# Patient Record
Sex: Female | Born: 2000 | Race: Black or African American | Hispanic: No | Marital: Single | Smoking: Never smoker
Health system: Southern US, Community
[De-identification: ages and names within clinical notes are randomized; demographics above are authoritative.]

## PROBLEM LIST (undated history)

## (undated) ENCOUNTER — Inpatient Hospital Stay (HOSPITAL_COMMUNITY): Payer: Self-pay

## (undated) DIAGNOSIS — D649 Anemia, unspecified: Secondary | ICD-10-CM

## (undated) DIAGNOSIS — N390 Urinary tract infection, site not specified: Secondary | ICD-10-CM

## (undated) DIAGNOSIS — B009 Herpesviral infection, unspecified: Secondary | ICD-10-CM

## (undated) DIAGNOSIS — B9689 Other specified bacterial agents as the cause of diseases classified elsewhere: Secondary | ICD-10-CM

## (undated) DIAGNOSIS — N760 Acute vaginitis: Secondary | ICD-10-CM

## (undated) DIAGNOSIS — A549 Gonococcal infection, unspecified: Secondary | ICD-10-CM

## (undated) DIAGNOSIS — Z789 Other specified health status: Secondary | ICD-10-CM

## (undated) DIAGNOSIS — A599 Trichomoniasis, unspecified: Secondary | ICD-10-CM

## (undated) DIAGNOSIS — A749 Chlamydial infection, unspecified: Secondary | ICD-10-CM

## (undated) HISTORY — DX: Herpesviral infection, unspecified: B00.9

## (undated) HISTORY — PX: NO PAST SURGERIES: SHX2092

---
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
## 2021-02-13 ENCOUNTER — Encounter (HOSPITAL_COMMUNITY): Payer: Self-pay | Admitting: Emergency Medicine

## 2021-02-13 ENCOUNTER — Other Ambulatory Visit: Payer: Self-pay

## 2021-02-13 ENCOUNTER — Emergency Department (HOSPITAL_COMMUNITY)
Admission: EM | Admit: 2021-02-13 | Discharge: 2021-02-13 | Disposition: A | Discharge status: Home / Self Care | Payer: Medicaid Other | Attending: Emergency Medicine | Admitting: Emergency Medicine

## 2021-02-13 DIAGNOSIS — Z202 Contact with and (suspected) exposure to infections with a predominantly sexual mode of transmission: Secondary | ICD-10-CM

## 2021-02-13 DIAGNOSIS — B3731 Acute candidiasis of vulva and vagina: Secondary | ICD-10-CM | POA: Insufficient documentation

## 2021-02-13 DIAGNOSIS — N898 Other specified noninflammatory disorders of vagina: Secondary | ICD-10-CM | POA: Diagnosis present

## 2021-02-13 LAB — WET PREP, GENITAL
Clue Cells Wet Prep HPF POC: NONE SEEN
Sperm: NONE SEEN
Trich, Wet Prep: NONE SEEN

## 2021-02-13 LAB — URINALYSIS, ROUTINE W REFLEX MICROSCOPIC
Bilirubin Urine: NEGATIVE
Glucose, UA: NEGATIVE mg/dL
Hgb urine dipstick: NEGATIVE
Ketones, ur: NEGATIVE mg/dL
Nitrite: NEGATIVE
Protein, ur: NEGATIVE mg/dL
Specific Gravity, Urine: 1.017 (ref 1.005–1.030)
pH: 5 (ref 5.0–8.0)

## 2021-02-13 LAB — HIV ANTIBODY (ROUTINE TESTING W REFLEX): HIV Screen 4th Generation wRfx: NONREACTIVE

## 2021-02-13 LAB — PREGNANCY, URINE: Preg Test, Ur: NEGATIVE

## 2021-02-13 MED ORDER — DOXYCYCLINE HYCLATE 100 MG PO CAPS: 100.0000 mg | ORAL_CAPSULE | Freq: Two times a day (BID) | ORAL | 0 refills | Status: DC

## 2021-02-13 MED ORDER — FLUCONAZOLE 200 MG PO TABS
200.0000 mg | ORAL_TABLET | Freq: Once | ORAL | Status: DC
  Filled 2021-02-13: qty 1

## 2021-02-13 MED ORDER — FLUCONAZOLE 200 MG PO TABS: 200.0000 mg | ORAL_TABLET | Freq: Every day | ORAL | 0 refills | Status: AC

## 2021-02-13 MED ORDER — STERILE WATER FOR INJECTION IJ SOLN
INTRAMUSCULAR | Status: AC
  Filled 2021-02-13: qty 10

## 2021-02-13 MED ORDER — DOXYCYCLINE HYCLATE 100 MG PO TABS: 100.0000 mg | ORAL_TABLET | Freq: Once | ORAL | Status: DC

## 2021-02-13 MED ORDER — CEFTRIAXONE SODIUM 500 MG IJ SOLR
500.0000 mg | Freq: Once | INTRAMUSCULAR | Status: AC
  Administered 2021-02-13: 500 mg via INTRAMUSCULAR
  Filled 2021-02-13: qty 500

## 2021-02-13 NOTE — Discharge Instructions (Addendum)
If the cultures come back on your MyChart account in the next 1 to 2 days and they are negative you can stop taking the doxycycline which is the antibiotic.  You were also given a prescription for a yeast medication which you did test positive for yeast as well.  In addition to the pills get the over-the-counter Monistat and use 1 or 2 applications of that as well which should clear up the yeast infection.  Avoid any sexual encounters until your symptoms completely resolve.  If you do test positive for gonorrhea or chlamydia your partner needs treatment.

## 2021-02-13 NOTE — ED Triage Notes (Signed)
Pt states she thinks she has a yeast infection.  Reports intermittent vaginal itching and yellow vaginal discharge.  Also reports urinary urgency.

## 2021-02-13 NOTE — ED Provider Notes (Signed)
Emergency Medicine Provider Triage Evaluation Note  Natasha Kelly , a 20 y.o. female  was evaluated in triage.  Pt complains of yellow vaginal discharge over the last 3 weeks.  She does have a history of yeast infections and states this feels similar.  She also mentions that her boyfriend recently cheated on her prior to her symptoms starting.  She reports associated urinary urgency but denies any dysuria, hematuria, fever, chills, and pelvic pain.  Review of Systems  Positive:  Negative: See above   Physical Exam  BP 128/68 (BP Location: Right Arm)   Pulse 84   Temp 98.8 F (37.1 C) (Oral)   Resp 16   LMP 01/23/2021   SpO2 100%  Gen:   Awake, no distress   Resp:  Normal effort  MSK:   Moves extremities without difficulty  Other:    Medical Decision Making  Medically screening exam initiated at 12:20 PM.  Appropriate orders placed.  Floella Ensz was informed that the remainder of the evaluation will be completed by another provider, this initial triage assessment does not replace that evaluation, and the importance of remaining in the ED until their evaluation is complete.    Honor Loh Butner, PA-C 02/13/21 1222    Derwood Kaplan, MD 02/14/21 [phone]
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
## 2021-02-14 LAB — GC/CHLAMYDIA PROBE AMP (~~LOC~~) NOT AT ARMC
Chlamydia: NEGATIVE
Comment: NEGATIVE
Comment: NORMAL
Neisseria Gonorrhea: NEGATIVE

## 2021-02-14 LAB — RPR: RPR Ser Ql: NONREACTIVE

## 2021-03-22 ENCOUNTER — Other Ambulatory Visit: Payer: Self-pay

## 2021-03-22 ENCOUNTER — Encounter (HOSPITAL_COMMUNITY): Payer: Self-pay | Admitting: Emergency Medicine

## 2021-03-22 ENCOUNTER — Emergency Department (HOSPITAL_COMMUNITY)
Admission: EM | Admit: 2021-03-22 | Discharge: 2021-03-22 | Disposition: A | Discharge status: Home / Self Care | Payer: Medicaid Other | Attending: Emergency Medicine | Admitting: Emergency Medicine

## 2021-03-22 DIAGNOSIS — A5901 Trichomonal vulvovaginitis: Secondary | ICD-10-CM | POA: Insufficient documentation

## 2021-03-22 DIAGNOSIS — N898 Other specified noninflammatory disorders of vagina: Secondary | ICD-10-CM | POA: Diagnosis present

## 2021-03-22 LAB — WET PREP, GENITAL
Clue Cells Wet Prep HPF POC: NONE SEEN
Sperm: NONE SEEN
Yeast Wet Prep HPF POC: NONE SEEN

## 2021-03-22 MED ORDER — LIDOCAINE HCL (PF) 1 % IJ SOLN
1.0000 mL | Freq: Once | INTRAMUSCULAR | Status: AC
  Administered 2021-03-22: 1 mL
  Filled 2021-03-22: qty 5

## 2021-03-22 MED ORDER — METRONIDAZOLE 500 MG PO TABS
2000.0000 mg | ORAL_TABLET | Freq: Once | ORAL | Status: AC
  Administered 2021-03-22: 2000 mg via ORAL
  Filled 2021-03-22: qty 4

## 2021-03-22 MED ORDER — DOXYCYCLINE HYCLATE 100 MG PO TABS: 100.0000 mg | ORAL_TABLET | Freq: Two times a day (BID) | ORAL | 0 refills | Status: AC

## 2021-03-22 MED ORDER — FLUCONAZOLE 150 MG PO TABS: 150.0000 mg | ORAL_TABLET | ORAL | 0 refills | Status: AC

## 2021-03-22 MED ORDER — CEFTRIAXONE SODIUM 500 MG IJ SOLR
500.0000 mg | Freq: Once | INTRAMUSCULAR | Status: AC
  Administered 2021-03-22: 500 mg via INTRAMUSCULAR
  Filled 2021-03-22: qty 500

## 2021-03-22 NOTE — ED Provider Notes (Signed)
[facility] EMERGENCY DEPARTMENT Provider Note   CSN: [id number] Arrival date & time: 03/22/21  1612     History Chief Complaint  Patient presents with   Vaginal Discharge    Natasha Kelly is a 20 y.o. female.   Vaginal Discharge  Patient presents with complaints of vaginal discharge and irritation that started over a week ago.  Patient thinks she might have a yeast infection.  She also wants to make sure she does not have chlamydia.  Patient has had intercourse recently and the condom broke.  Patient has noticed some yellow discharge from the vaginal area.  She is also having some vaginal itching and burning.  She denies any dysuria although does have some suprapubic discomfort.  No fevers or chills.  Patient was seen in the emergency room last month and was diagnosed with vaginal candidiasis.  She feels that her symptoms are similar.  Patient is also concerned that she has chlamydia.  She is not aware of any definite partner exposure but she is requesting empiric treatment  Past Medical History:  Diagnosis Date   Medical history non-contributory     There are no problems to display for this patient.   Past Surgical History:  Procedure Laterality Date   NO PAST SURGERIES       OB History     Gravida  1   Para      Term      Preterm      AB  1   Living         SAB  1   IAB      Ectopic      Multiple      Live Births              Family History  Problem Relation Age of Onset   Healthy Mother    Healthy Father     Social History   Tobacco Use   Smoking status: Never   Smokeless tobacco: Never  Vaping Use   Vaping Use: Never used  Substance Use Topics   Alcohol use: Never   Drug use: Never    Home Medications Prior to Admission medications   Medication Sig Start Date End Date Taking? Authorizing Provider  doxycycline (VIBRA-TABS) 100 MG tablet Take 1 tablet (100 mg total) by mouth 2 (two) times daily for 7 days.  03/22/21 03/29/21 Yes Linwood Dibbles, MD  fluconazole (DIFLUCAN) 150 MG tablet Take 1 tablet (150 mg total) by mouth every 3 (three) days for 3 doses. 03/22/21 03/29/21 Yes Linwood Dibbles, MD  doxycycline (VIBRAMYCIN) 100 MG capsule Take 1 capsule (100 mg total) by mouth 2 (two) times daily. 02/13/21   Gwyneth Sprout, MD  ferrous sulfate 325 (65 FE) MG tablet Take 1 tablet (325 mg total) by mouth every other day. 09/07/20 10/07/20  Alric Seton, MD  vitamin C (ASCORBIC ACID) 250 MG tablet Take 1 tablet (250 mg total) by mouth every other day. Take with ferrous sulfate Patient not taking: Reported on 09/14/2020 09/07/20   Alric Seton, MD    Allergies    Patient has no known allergies.  Review of Systems   Review of Systems  Genitourinary:  Positive for vaginal discharge.  All other systems reviewed and are negative.  Physical Exam Updated Vital Signs BP (!) 142/83   Pulse (!) 104   Temp 98.1 F (36.7 C) (Oral)   Resp 16   Ht 1.6 m (5\' 3" )   Wt  56.7 kg   LMP 03/21/2021   SpO2 100%   BMI 22.14 kg/m   Physical Exam Vitals and nursing note reviewed.  Constitutional:      General: She is not in acute distress.    Appearance: She is well-developed.  HENT:     Head: Normocephalic and atraumatic.     Right Ear: External ear normal.     Left Ear: External ear normal.  Eyes:     General: No scleral icterus.       Right eye: No discharge.        Left eye: No discharge.     Conjunctiva/sclera: Conjunctivae normal.  Neck:     Trachea: No tracheal deviation.  Cardiovascular:     Rate and Rhythm: Normal rate.  Pulmonary:     Effort: Pulmonary effort is normal. No respiratory distress.     Breath sounds: No stridor.  Abdominal:     General: There is no distension.  Genitourinary:    Exam position: Supine.     Labia:        Right: No rash, tenderness, lesion or injury.        Left: No rash, tenderness, lesion or injury.      Vagina: No signs of injury and foreign body.  Vaginal discharge (white, thick) present. No erythema or tenderness.     Cervix: No cervical motion tenderness, discharge or friability.     Uterus: Not tender.      Adnexa:        Right: No mass, tenderness or fullness.         Left: No mass, tenderness or fullness.    Musculoskeletal:        General: No swelling or deformity.     Cervical back: Neck supple.  Skin:    General: Skin is warm and dry.     Findings: No rash.  Neurological:     Mental Status: She is alert.     Cranial Nerves: Cranial nerve deficit: no gross deficits.    ED Results / Procedures / Treatments   Labs (all labs ordered are listed, but only abnormal results are displayed) Labs Reviewed  WET PREP, GENITAL - Abnormal; Notable for the following components:      Result Value   Trich, Wet Prep PRESENT (*)    WBC, Wet Prep HPF POC MODERATE (*)    All other components within normal limits  I-STAT BETA HCG BLOOD, ED ([facility], [facility], [facility] ONLY)  GC/CHLAMYDIA PROBE AMP ([place]) NOT AT [facility]    EKG None  Radiology No results found.  Procedures Procedures   Medications Ordered in ED Medications  cefTRIAXone (ROCEPHIN) injection 500 mg (500 mg Intramuscular Given 03/22/21 1825)  lidocaine (PF) (XYLOCAINE) 1 % injection 1 mL (1 mL Other Given 03/22/21 1826)  metroNIDAZOLE (FLAGYL) tablet 2,000 mg (2,000 mg Oral Given 03/22/21 1826)    ED Course  I have reviewed the triage vital signs and the nursing notes.  Pertinent labs & imaging results that were available during my care of the patient were reviewed by me and considered in my medical decision making (see chart for details).  Clinical Course as of 03/22/21 2334  Tue Mar 22, 2021  1716 Prior records reviewed.  Patient had an HIV and RPR test done last month.  Both negative [JK]    Clinical Course User Index [JK] Linwood Dibbles, MD   MDM Rules/Calculators/A&P  Patient with complaints of vaginal discharge.  She does have thick white  discharge suggestive of yeast infection.  Patient is also concerned about STI and is requesting empiric treatment.  Will discharge home with prescription for doxycycline.  Patient was given ceftriaxone in the ED.  I will also give her a few days of Diflucan considering her recurrent yeast infection symptoms.  Recommend outpatient follow-up with gynecology Final Clinical Impression(s) / ED Diagnoses Final diagnoses:  Vaginal discharge  Trichomonal vaginitis    Rx / DC Orders ED Discharge Orders          Ordered    doxycycline (VIBRA-TABS) 100 MG tablet  2 times daily        03/22/21 1738    fluconazole (DIFLUCAN) 150 MG tablet  Every 3 DAYS        03/22/21 1741             Linwood Dibbles, MD 03/22/21 2334

## 2021-03-22 NOTE — ED Triage Notes (Signed)
Patient requesting to be evaluated for yeast infection and STI. Reports about 3-4 weeks ago treated for a yeast infection and does not believe it has cleared up. Reports recent intercourse where the condom broke. Reports clear to yellow vaginal discharge.

## 2021-03-22 NOTE — ED Notes (Signed)
DC instructions reviewed with pt. Pt verbalized understanding.  PT DC.  

## 2021-03-22 NOTE — Discharge Instructions (Addendum)
Take patient as prescribed.  Schedule an appointment to follow-up with an OB/GYN doctor if the symptoms persist  You were given antibiotics in the ED that treat for trichomonas and gonorrhea.  The prescriptions will cover chlamydia and a yeast infection.     Your partner should also be treated and you should avoid intercourse until treatment is complete.

## 2021-03-22 NOTE — ED Provider Notes (Signed)
Emergency Medicine Provider Triage Evaluation Note  Natasha Kelly , a 20 y.o. female  was evaluated in triage.  Pt complains of vaginal discharge.  She states that she was evaluated last week and diagnosed with a yeast infection.  She states that she was given Diflucan.  She states that she still feels like she is having a yeast infection and is also concerned about STDs.  She states that she had sex with a female partner was using a condom however this broke.  She states that she is having yellow discharge from her vagina.  She is also having itching and burning.  She does endorse some mild suprapubic abdominal pain.  Denies urinary symptoms or fever.  Last menstrual period ended yesterday.  On chart review it appears that she was found to have vaginal Candida and prescribed fluconazole.  While she was in the emergency department she was also empirically treated for STDs given IM Rocephin and discharged with doxycycline.  Review of Systems  Positive: Vaginal discharge Negative: Fever  Physical Exam  BP 136/88 (BP Location: Right Arm)   Pulse 95   Temp 98.3 F (36.8 C) (Oral)   Resp 18   Ht 5\' 3"  (1.6 m)   Wt 56.7 kg   LMP 03/21/2021   SpO2 100%   BMI 22.14 kg/m  Gen:   Awake, no distress   Resp:  Normal effort  MSK:   Moves extremities without difficulty  Other:  Abdomen is soft.  Bowel sounds present.  She has mild tenderness to palpation of the suprapubic region.  No rebound tenderness.  Medical Decision Making  Medically screening exam initiated at 4:40 PM.  Appropriate orders placed.  Natasha Kelly was informed that the remainder of the evaluation will be completed by another provider, this initial triage assessment does not replace that evaluation, and the importance of remaining in the ED until their evaluation is complete.     Natasha Leyden, PA-C 03/22/21 1642    13/08/22, MD 03/23/21 [phone]

## 2021-03-23 LAB — GC/CHLAMYDIA PROBE AMP (~~LOC~~) NOT AT ARMC
Chlamydia: NEGATIVE
Comment: NEGATIVE
Comment: NORMAL
Neisseria Gonorrhea: NEGATIVE

## 2021-03-24 ENCOUNTER — Telehealth: Payer: Self-pay | Admitting: *Deleted

## 2021-03-24 NOTE — Telephone Encounter (Signed)
Pt called regarding test results.  °

## 2021-03-25 ENCOUNTER — Telehealth (HOSPITAL_COMMUNITY): Payer: Self-pay

## 2021-04-03 ENCOUNTER — Encounter (HOSPITAL_COMMUNITY): Payer: Self-pay | Admitting: Emergency Medicine

## 2021-04-03 ENCOUNTER — Emergency Department (HOSPITAL_COMMUNITY)
Admission: EM | Admit: 2021-04-03 | Discharge: 2021-04-03 | Disposition: A | Discharge status: Home / Self Care | Payer: Medicaid Other | Attending: Emergency Medicine | Admitting: Emergency Medicine

## 2021-04-03 ENCOUNTER — Other Ambulatory Visit: Payer: Self-pay

## 2021-04-03 DIAGNOSIS — N898 Other specified noninflammatory disorders of vagina: Secondary | ICD-10-CM | POA: Diagnosis present

## 2021-04-03 MED ORDER — METRONIDAZOLE 500 MG PO TABS: 500.0000 mg | ORAL_TABLET | Freq: Two times a day (BID) | ORAL | 0 refills | Status: DC

## 2021-04-03 NOTE — ED Triage Notes (Signed)
Pt states she was diagnosed here with trich and completed treatment but continues to have symptoms of discharge, itching, and burning.  Pt denies unprotected sex during treatment.

## 2021-04-03 NOTE — ED Provider Notes (Signed)
[facility] EMERGENCY DEPARTMENT Provider Note   CSN: [id number] Arrival date & time: 04/03/21  1650     History Chief Complaint  Patient presents with   Vaginal Discharge    Natasha Kelly is a 20 y.o. female.  HPI 20 year old female with recent history of trichomoniasis, seen on November 8, also treated with Rocephin,, Doxycycline, Diflucan, presents today with complaints of ongoing vaginal itching and discharge.  She is not having pain.  States her last menstrual period was 2 weeks ago was normal.  She states she is not pregnant.  She has not had fever or abdominal pain.  She did not seek follow-up after her last ED visit.  She reports taking medications as prescribed Review of records reveal she had a negative urine chlamydia test.  She was treated with Rocephin at that time.  She reports using barrier method for birth control.  Past Medical History:  Diagnosis Date   Medical history non-contributory     There are no problems to display for this patient.   Past Surgical History:  Procedure Laterality Date   NO PAST SURGERIES       OB History     Gravida  1   Para      Term      Preterm      AB  1   Living         SAB  1   IAB      Ectopic      Multiple      Live Births              Family History  Problem Relation Age of Onset   Healthy Mother    Healthy Father     Social History   Tobacco Use   Smoking status: Never   Smokeless tobacco: Never  Vaping Use   Vaping Use: Never used  Substance Use Topics   Alcohol use: Never   Drug use: Never    Home Medications Prior to Admission medications   Medication Sig Start Date End Date Taking? Authorizing Provider  metroNIDAZOLE (FLAGYL) 500 MG tablet Take 1 tablet (500 mg total) by mouth 2 (two) times daily. 04/03/21  Yes Margarita Grizzle, MD  doxycycline (VIBRAMYCIN) 100 MG capsule Take 1 capsule (100 mg total) by mouth 2 (two) times daily. 02/13/21   Gwyneth Sprout,  MD  ferrous sulfate 325 (65 FE) MG tablet Take 1 tablet (325 mg total) by mouth every other day. 09/07/20 10/07/20  Alric Seton, MD  vitamin C (ASCORBIC ACID) 250 MG tablet Take 1 tablet (250 mg total) by mouth every other day. Take with ferrous sulfate Patient not taking: Reported on 09/14/2020 09/07/20   Alric Seton, MD    Allergies    Patient has no known allergies.  Review of Systems   Review of Systems  All other systems reviewed and are negative.  Physical Exam Updated Vital Signs BP 129/76 (BP Location: Right Arm)   Pulse 92   Temp 98 F (36.7 C) (Oral)   Resp 19   Ht 1.6 m (5\' 3" )   Wt 56.7 kg   LMP 03/21/2021   SpO2 100%   BMI 22.14 kg/m   Physical Exam Vitals reviewed.  HENT:     Head: Normocephalic.     Right Ear: External ear normal.     Left Ear: External ear normal.     Nose: Nose normal.     Mouth/Throat:  Pharynx: Oropharynx is clear.  Cardiovascular:     Rate and Rhythm: Normal rate.  Pulmonary:     Effort: Pulmonary effort is normal.  Abdominal:     General: Abdomen is flat. Bowel sounds are normal. There is no distension.     Palpations: There is no mass.     Tenderness: There is no abdominal tenderness.  Musculoskeletal:     Cervical back: Normal range of motion.  Skin:    General: Skin is warm.     Capillary Refill: Capillary refill takes less than 2 seconds.  Neurological:     General: No focal deficit present.     Mental Status: She is alert.  Psychiatric:        Mood and Affect: Mood normal.    ED Results / Procedures / Treatments   Labs (all labs ordered are listed, but only abnormal results are displayed) Labs Reviewed - No data to display  EKG None  Radiology No results found.  Procedures Procedures   Medications Ordered in ED Medications - No data to display  ED Course  I have reviewed the triage vital signs and the nursing notes.  Pertinent labs & imaging results that were available during my care  of the patient were reviewed by me and considered in my medical decision making (see chart for details).    MDM Rules/Calculators/A&P                         Patient with no new symptoms since last visit.  Cultures were negative last visit.  I deferred pelvic exam given that she is not having any new symptoms.  Have advised her to take Flagyl 500 mg twice a day for 7 days and to follow-up with the women's clinic. Final Clinical Impression(s) / ED Diagnoses Final diagnoses:  Vaginal itching    Rx / DC Orders ED Discharge Orders          Ordered    metroNIDAZOLE (FLAGYL) 500 MG tablet  2 times daily        04/03/21 1715             Margarita Grizzle, MD 04/03/21 1719

## 2021-04-22 ENCOUNTER — Encounter (HOSPITAL_COMMUNITY): Payer: Self-pay | Admitting: Emergency Medicine

## 2021-04-22 ENCOUNTER — Other Ambulatory Visit: Payer: Self-pay

## 2021-04-22 ENCOUNTER — Emergency Department (HOSPITAL_COMMUNITY)
Admission: EM | Admit: 2021-04-22 | Discharge: 2021-04-22 | Disposition: A | Discharge status: Home / Self Care | Payer: Medicaid Other | Attending: Emergency Medicine | Admitting: Emergency Medicine

## 2021-04-22 DIAGNOSIS — N898 Other specified noninflammatory disorders of vagina: Secondary | ICD-10-CM | POA: Insufficient documentation

## 2021-04-22 LAB — POC URINE PREG, ED: Preg Test, Ur: NEGATIVE

## 2021-04-22 LAB — URINALYSIS, ROUTINE W REFLEX MICROSCOPIC
Bilirubin Urine: NEGATIVE
Glucose, UA: NEGATIVE mg/dL
Hgb urine dipstick: NEGATIVE
Ketones, ur: NEGATIVE mg/dL
Leukocytes,Ua: NEGATIVE
Nitrite: NEGATIVE
Protein, ur: NEGATIVE mg/dL
Specific Gravity, Urine: 1.015 (ref 1.005–1.030)
pH: 8.5 — ABNORMAL HIGH (ref 5.0–8.0)

## 2021-04-22 LAB — WET PREP, GENITAL
Clue Cells Wet Prep HPF POC: NONE SEEN
Sperm: NONE SEEN
Trich, Wet Prep: NONE SEEN
WBC, Wet Prep HPF POC: <10 (ref <10)
Yeast Wet Prep HPF POC: NONE SEEN

## 2021-04-22 NOTE — ED Provider Notes (Signed)
[facility] EMERGENCY DEPARTMENT Provider Note  CSN: [id number] Arrival date & time: 04/22/21 0845    History No chief complaint on file.   Natasha Kelly is a 20 y.o. female here for recurrent vaginal discharge and itching. Has been ongoing off and on since last summer. Seen in the ED several times. Swabs have generally been negative except for Nov 8 when trichomonas was positive. She has not had any outpatient follow up. She reports discharge has come back and she wants to make sure she does not still have trichomonas. She has not had any unprotected sex since her last visit.   Past Medical History:  Diagnosis Date   Medical history non-contributory     Past Surgical History:  Procedure Laterality Date   NO PAST SURGERIES      Family History  Problem Relation Age of Onset   Healthy Mother    Healthy Father     Social History   Tobacco Use   Smoking status: Never   Smokeless tobacco: Never  Vaping Use   Vaping Use: Never used  Substance Use Topics   Alcohol use: Never   Drug use: Never     Home Medications Prior to Admission medications   Medication Sig Start Date End Date Taking? Authorizing Provider  ferrous sulfate 325 (65 FE) MG tablet Take 1 tablet (325 mg total) by mouth every other day. 09/07/20 10/07/20  Alric Seton, MD  vitamin C (ASCORBIC ACID) 250 MG tablet Take 1 tablet (250 mg total) by mouth every other day. Take with ferrous sulfate Patient not taking: Reported on 09/14/2020 09/07/20   Alric Seton, MD     Allergies    Patient has no known allergies.   Review of Systems   Review of Systems A comprehensive review of systems was completed and negative except as noted in HPI.    Physical Exam BP 106/66   Pulse 96   Temp 98.5 F (36.9 C)   Resp 18   LMP 04/14/2021 (Approximate)   SpO2 100%   Physical Exam Vitals and nursing note reviewed.  Constitutional:      Appearance: Normal appearance.  HENT:     Head:  Normocephalic and atraumatic.     Nose: Nose normal.     Mouth/Throat:     Mouth: Mucous membranes are moist.  Eyes:     Extraocular Movements: Extraocular movements intact.     Conjunctiva/sclera: Conjunctivae normal.  Cardiovascular:     Rate and Rhythm: Normal rate.  Pulmonary:     Effort: Pulmonary effort is normal.     Breath sounds: Normal breath sounds.  Abdominal:     General: Abdomen is flat.     Palpations: Abdomen is soft.     Tenderness: There is no abdominal tenderness.  Genitourinary:    Comments: Chaperone present, normal external genitalia, no sores. Mild thick white discharge, cervical os is normal.  Musculoskeletal:        General: No swelling. Normal range of motion.     Cervical back: Neck supple.  Skin:    General: Skin is warm and dry.  Neurological:     General: No focal deficit present.     Mental Status: She is alert.  Psychiatric:        Mood and Affect: Mood normal.     ED Results / Procedures / Treatments   Labs (all labs ordered are listed, but only abnormal results are displayed) Labs Reviewed  URINALYSIS, ROUTINE W REFLEX  MICROSCOPIC - Abnormal; Notable for the following components:      Result Value   pH 8.5 (*)    All other components within normal limits  WET PREP, GENITAL  POC URINE PREG, ED  GC/CHLAMYDIA PROBE AMP ([place]) NOT AT [facility]    EKG None  Radiology No results found.  Procedures Procedures  Medications Ordered in the ED Medications - No data to display   MDM Rules/Calculators/A&P MDM   ED Course  I have reviewed the triage vital signs and the nursing notes.  Pertinent labs & imaging results that were available during my care of the patient were reviewed by me and considered in my medical decision making (see chart for details).  Clinical Course as of 04/22/21 1136  Fri Apr 22, 2021  0354 Hcg is neg.  [CS]  1049 Wet prep is neg.  [CS]  1134 UA is neg. Patient advised no signs of acute infection now.  Again advised to follow up with Gyn. Given referral to [facility].  [CS]    Clinical Course User Index [CS] Pollyann Savoy, MD    Final Clinical Impression(s) / ED Diagnoses Final diagnoses:  Vaginal discharge    Rx / DC Orders ED Discharge Orders     None        Pollyann Savoy, MD 04/22/21 1136

## 2021-04-22 NOTE — ED Triage Notes (Signed)
Pt here from from home with c/o vaginal discharge some burning upon urination , no abnormal bleeding

## 2021-04-25 LAB — GC/CHLAMYDIA PROBE AMP (~~LOC~~) NOT AT ARMC
Chlamydia: NEGATIVE
Comment: NEGATIVE
Comment: NORMAL
Neisseria Gonorrhea: NEGATIVE

## 2021-05-04 ENCOUNTER — Encounter (HOSPITAL_COMMUNITY): Payer: Self-pay | Admitting: Emergency Medicine

## 2021-05-04 ENCOUNTER — Emergency Department (HOSPITAL_COMMUNITY)
Admission: EM | Admit: 2021-05-04 | Discharge: 2021-05-04 | Disposition: A | Discharge status: Home / Self Care | Payer: Medicaid Other | Attending: Emergency Medicine | Admitting: Emergency Medicine

## 2021-05-04 DIAGNOSIS — N898 Other specified noninflammatory disorders of vagina: Secondary | ICD-10-CM | POA: Diagnosis present

## 2021-05-04 DIAGNOSIS — L292 Pruritus vulvae: Secondary | ICD-10-CM | POA: Insufficient documentation

## 2021-05-04 LAB — WET PREP, GENITAL
Clue Cells Wet Prep HPF POC: NONE SEEN
Sperm: NONE SEEN
Trich, Wet Prep: NONE SEEN
WBC, Wet Prep HPF POC: >=10 — AB (ref <10)
Yeast Wet Prep HPF POC: NONE SEEN

## 2021-05-04 LAB — URINALYSIS, ROUTINE W REFLEX MICROSCOPIC
Bilirubin Urine: NEGATIVE
Glucose, UA: NEGATIVE mg/dL
Hgb urine dipstick: NEGATIVE
Ketones, ur: 5 mg/dL — AB
Nitrite: NEGATIVE
Protein, ur: 30 mg/dL — AB
Specific Gravity, Urine: 1.026 (ref 1.005–1.030)
pH: 6 (ref 5.0–8.0)

## 2021-05-04 LAB — POC URINE PREG, ED: Preg Test, Ur: NEGATIVE

## 2021-05-04 NOTE — ED Triage Notes (Signed)
Pt endorses white vaginal discharge and itching since Saturday. Endorses burning with urination.

## 2021-05-04 NOTE — ED Provider Notes (Signed)
[facility] EMERGENCY DEPARTMENT Provider Note   CSN: [id number] Arrival date & time: 05/04/21  6060     History Chief Complaint  Patient presents with   Vaginal Discharge    Natasha Kelly is a 20 y.o. female.   Vaginal Discharge Associated symptoms: dysuria   Associated symptoms: no abdominal pain, no fever, no nausea and no vomiting   Patient presents for white vaginal discharge and itching for the past 4 days.  She has a history of ED visits for similar symptoms.  Most recently, she was seen 12 days ago.  Urinalysis and vaginal swabs were negative for infection at that time.  She has engaged in unprotected sex since her last visit.  She does state that her partner was recently tested positive for trichomonas.  She denies any systemic symptoms.  She has had recent dysuria.    Past Medical History:  Diagnosis Date   Medical history non-contributory     There are no problems to display for this patient.   Past Surgical History:  Procedure Laterality Date   NO PAST SURGERIES       OB History     Gravida  1   Para      Term      Preterm      AB  1   Living         SAB  1   IAB      Ectopic      Multiple      Live Births              Family History  Problem Relation Age of Onset   Healthy Mother    Healthy Father     Social History   Tobacco Use   Smoking status: Never   Smokeless tobacco: Never  Vaping Use   Vaping Use: Never used  Substance Use Topics   Alcohol use: Never   Drug use: Never    Home Medications Prior to Admission medications   Medication Sig Start Date End Date Taking? Authorizing Provider  ferrous sulfate 325 (65 FE) MG tablet Take 1 tablet (325 mg total) by mouth every other day. 09/07/20 10/07/20  Alric Seton, MD  vitamin C (ASCORBIC ACID) 250 MG tablet Take 1 tablet (250 mg total) by mouth every other day. Take with ferrous sulfate Patient not taking: Reported on 09/14/2020 09/07/20    Alric Seton, MD    Allergies    Patient has no known allergies.  Review of Systems   Review of Systems  Constitutional:  Negative for chills, fatigue and fever.  HENT:  Negative for ear pain and sore throat.   Eyes:  Negative for pain and visual disturbance.  Respiratory:  Negative for cough and shortness of breath.   Cardiovascular:  Negative for chest pain and palpitations.  Gastrointestinal:  Negative for abdominal pain, diarrhea, nausea and vomiting.  Genitourinary:  Positive for dysuria and vaginal discharge. Negative for flank pain, frequency, genital sores, hematuria and pelvic pain.  Musculoskeletal:  Negative for arthralgias, back pain, myalgias and neck pain.  Skin:  Negative for color change and rash.  Neurological:  Negative for dizziness, seizures, syncope, weakness, light-headedness and numbness.  Hematological:  Does not bruise/bleed easily.  Psychiatric/Behavioral:  Negative for confusion and decreased concentration.   All other systems reviewed and are negative.  Physical Exam Updated Vital Signs BP 116/69    Pulse 85    Temp 99.3 F (37.4 C) (Oral)  Resp 17    Ht 5\' 2"  (1.575 m)    LMP 04/14/2021 (Approximate)    SpO2 100%    BMI 22.86 kg/m   Physical Exam Vitals and nursing note reviewed. Exam conducted with a chaperone present.  Constitutional:      General: She is not in acute distress.    Appearance: Normal appearance. She is well-developed and normal weight. She is not ill-appearing, toxic-appearing or diaphoretic.  HENT:     Head: Normocephalic and atraumatic.     Right Ear: External ear normal.     Left Ear: External ear normal.     Nose: Nose normal.  Eyes:     General: No scleral icterus.    Extraocular Movements: Extraocular movements intact.     Conjunctiva/sclera: Conjunctivae normal.  Cardiovascular:     Rate and Rhythm: Normal rate and regular rhythm.     Heart sounds: No murmur heard. Pulmonary:     Effort: Pulmonary effort is  normal. No respiratory distress.  Abdominal:     Palpations: Abdomen is soft.     Tenderness: There is no abdominal tenderness. There is no right CVA tenderness or left CVA tenderness.  Genitourinary:    Exam position: Lithotomy position.     Pubic Area: No rash.      Labia:        Right: No lesion.        Left: No lesion.      Vagina: Normal. No foreign body. No erythema or bleeding.     Cervix: No discharge, friability, lesion, erythema or cervical bleeding.  Musculoskeletal:        General: No swelling. Normal range of motion.     Cervical back: Normal range of motion and neck supple.  Skin:    General: Skin is warm and dry.     Capillary Refill: Capillary refill takes less than 2 seconds.     Coloration: Skin is not jaundiced or pale.  Neurological:     General: No focal deficit present.     Mental Status: She is alert and oriented to person, place, and time.     Cranial Nerves: No cranial nerve deficit.     Sensory: No sensory deficit.     Motor: No weakness.  Psychiatric:        Mood and Affect: Mood normal.        Behavior: Behavior normal.        Thought Content: Thought content normal.        Judgment: Judgment normal.    ED Results / Procedures / Treatments   Labs (all labs ordered are listed, but only abnormal results are displayed) Labs Reviewed  WET PREP, GENITAL - Abnormal; Notable for the following components:      Result Value   WBC, Wet Prep HPF POC >=10 (*)    All other components within normal limits  URINALYSIS, ROUTINE W REFLEX MICROSCOPIC - Abnormal; Notable for the following components:   APPearance CLOUDY (*)    Ketones, ur 5 (*)    Protein, ur 30 (*)    Leukocytes,Ua LARGE (*)    Bacteria, UA RARE (*)    All other components within normal limits  POC URINE PREG, ED  GC/CHLAMYDIA PROBE AMP ([place]) NOT AT [facility]    EKG None  Radiology No results found.  Procedures Procedures   Medications Ordered in ED Medications - No data to  display  ED Course  I have reviewed the triage vital signs  and the nursing notes.  Pertinent labs & imaging results that were available during my care of the patient were reviewed by me and considered in my medical decision making (see chart for details).    MDM Rules/Calculators/A&P                         Healthy 20 year old female with concerns of vaginal discharge and irritation.  On exam, she is well-appearing.  Vital signs are normal.  She did undergo a pelvic examination with nurse chaperone present.  On pelvic exam, patient has what appears to be physiologic discharge.  There were no areas of erythema or friability.  Wet prep and GC/chlamydia swabs were obtained.  Wet prep was negative for yeast, BV, or trichomonas infection.  Patient was advised to follow-up on results of GC/committee a testing.  Based on exam, no indication for empiric antibiotic treatment at this time.  Given her frequent visits to the ED for similar symptoms, patient was encouraged to establish follow-up with PCP and/or OB/GYN.  Patient was discharged in good condition.  Final Clinical Impression(s) / ED Diagnoses Final diagnoses:  Vaginal discharge  Vaginal itching    Rx / DC Orders ED Discharge Orders          Ordered    Ambulatory referral to Gynecology        05/04/21 1350             Godfrey Pick, MD 05/04/21 1911

## 2021-05-04 NOTE — ED Notes (Signed)
Pt left without discharge paperwork and vital signs

## 2021-05-04 NOTE — Discharge Instructions (Signed)
Results of gonorrhea and Chlamydia testing will be available in 1 to 2 days.  You can find out results on MyChart.  You should establish a primary care doctor and/or a gynecologist.  There are telephone numbers below to call to do so.  If you are able to schedule a follow-up appointment, you can discuss the results of testing at that time as well.

## 2021-05-04 NOTE — ED Notes (Signed)
rN called pt 3x. No answer

## 2021-05-05 LAB — GC/CHLAMYDIA PROBE AMP (~~LOC~~) NOT AT ARMC
Chlamydia: POSITIVE — AB
Comment: NEGATIVE
Comment: NORMAL
Neisseria Gonorrhea: NEGATIVE

## 2021-05-06 ENCOUNTER — Other Ambulatory Visit: Payer: Self-pay

## 2021-05-06 ENCOUNTER — Emergency Department (HOSPITAL_COMMUNITY)
Admission: EM | Admit: 2021-05-06 | Discharge: 2021-05-06 | Disposition: A | Discharge status: Home / Self Care | Payer: Medicaid Other | Attending: Emergency Medicine | Admitting: Emergency Medicine

## 2021-05-06 ENCOUNTER — Encounter (HOSPITAL_COMMUNITY): Payer: Self-pay | Admitting: Emergency Medicine

## 2021-05-06 ENCOUNTER — Telehealth: Payer: Self-pay

## 2021-05-06 DIAGNOSIS — A749 Chlamydial infection, unspecified: Secondary | ICD-10-CM | POA: Diagnosis not present

## 2021-05-06 DIAGNOSIS — R21 Rash and other nonspecific skin eruption: Secondary | ICD-10-CM | POA: Diagnosis present

## 2021-05-06 MED ORDER — DOXYCYCLINE HYCLATE 100 MG PO TABS: 100.0000 mg | ORAL_TABLET | Freq: Two times a day (BID) | ORAL | 0 refills | Status: DC
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
## 2021-05-06 NOTE — ED Notes (Signed)
RN reviewed discharge instructions w/ pt. Follow up and prescriptions reviewed. Pt had no further questions 

## 2021-05-06 NOTE — ED Triage Notes (Signed)
Pt was seen here Wednesday for STI symptoms, she checked her MyChart today and showed positive for chlamydia, pt here for treatment

## 2021-05-06 NOTE — ED Provider Notes (Signed)
[facility] EMERGENCY DEPARTMENT Provider Note   CSN: [id number] Arrival date & time: 05/06/21  5573     History No chief complaint on file.   Natasha Kelly is a 20 y.o. female no significant past medical history who presents after being evaluated on Wednesday for STI.  Patient reports that she was having some vaginal discharge, vaginal itching, and came to get tested for STI.  Patient denies dyspareunia.  Patient denies vaginal bleeding, reports normal last menstrual period.  Patient denies abdominal pain, nausea, vomiting.  Patient denies worsening of symptoms since she was evaluated in Wednesday, but reports that her chart was positive for chlamydia, negative for gonorrhea.  Otherwise unremarkable work-up.  HPI     Past Medical History:  Diagnosis Date   Medical history non-contributory     There are no problems to display for this patient.   Past Surgical History:  Procedure Laterality Date   NO PAST SURGERIES       OB History     Gravida  1   Para      Term      Preterm      AB  1   Living         SAB  1   IAB      Ectopic      Multiple      Live Births              Family History  Problem Relation Age of Onset   Healthy Mother    Healthy Father     Social History   Tobacco Use   Smoking status: Never   Smokeless tobacco: Never  Vaping Use   Vaping Use: Never used  Substance Use Topics   Alcohol use: Never   Drug use: Never    Home Medications Prior to Admission medications   Medication Sig Start Date End Date Taking? Authorizing Provider  doxycycline (VIBRA-TABS) 100 MG tablet Take 1 tablet (100 mg total) by mouth 2 (two) times daily. 05/06/21  Yes Berenis Corter H, PA-C  ferrous sulfate 325 (65 FE) MG tablet Take 1 tablet (325 mg total) by mouth every other day. 09/07/20 10/07/20  Alric Seton, MD  vitamin C (ASCORBIC ACID) 250 MG tablet Take 1 tablet (250 mg total) by mouth every other day. Take  with ferrous sulfate Patient not taking: Reported on 09/14/2020 09/07/20   Alric Seton, MD    Allergies    Patient has no known allergies.  Review of Systems   Review of Systems  Genitourinary:  Positive for vaginal discharge.  All other systems reviewed and are negative.  Physical Exam Updated Vital Signs BP 132/63 (BP Location: Left Arm)    Pulse 87    Temp 98.7 F (37.1 C) (Oral)    Resp 14    LMP 04/14/2021 (Approximate)    SpO2 99%   Physical Exam Vitals and nursing note reviewed.  Constitutional:      General: She is not in acute distress.    Appearance: Normal appearance.  HENT:     Head: Normocephalic and atraumatic.  Eyes:     General:        Right eye: No discharge.        Left eye: No discharge.  Cardiovascular:     Rate and Rhythm: Normal rate and regular rhythm.  Pulmonary:     Effort: Pulmonary effort is normal. No respiratory distress.  Abdominal:     Comments:  No tenderness palpation throughout the abdomen.  No rebound, rigidity, guarding.  No tenderness palpation suprapubic region.  Genitourinary:    Comments: I did not repeat pelvic exam today, patient was fully evaluated 2 days ago with no new symptoms. Musculoskeletal:        General: No deformity.  Skin:    General: Skin is warm and dry.  Neurological:     Mental Status: She is alert and oriented to person, place, and time.  Psychiatric:        Mood and Affect: Mood normal.        Behavior: Behavior normal.    ED Results / Procedures / Treatments   Labs (all labs ordered are listed, but only abnormal results are displayed) Labs Reviewed - No data to display  EKG None  Radiology No results found.  Procedures Procedures   Medications Ordered in ED Medications - No data to display  ED Course  I have reviewed the triage vital signs and the nursing notes.  Pertinent labs & imaging results that were available during my care of the patient were reviewed by me and considered in my  medical decision making (see chart for details).    MDM Rules/Calculators/A&P                         Overall well-appearing patient with no significant past medical history presents for treatment for chlamydia after receiving positive diagnosis.  Patient had pelvic exam without significant signs or symptoms of tubo-ovarian abscess.  Patient is afebrile, stable vital signs.  Otherwise unremarkable work-up from 2 days ago.  Patient with no abdominal pain on my exam, no acute distress.  We will not repeat pelvic exam at this time.  Patient given 7-day prescription for doxycycline, encouraged to refrain from sexual activity until she is fully symptom-free, and is completely completed her treatment.  Patient encouraged to inform all current sexual partners of her diagnosis.  Patient discharged in stable condition, return precautions given.  Final Clinical Impression(s) / ED Diagnoses Final diagnoses:  Chlamydia    Rx / DC Orders ED Discharge Orders          Ordered    doxycycline (VIBRA-TABS) 100 MG tablet  2 times daily        05/06/21 1715             Surena Welge, Edyth Gunnels 05/06/21 2019    Eber Hong, MD 05/06/21 2308

## 2021-05-06 NOTE — Telephone Encounter (Signed)
Natasha Kelly referring for vaginal discharge and irritation.. Called and left voicemail for patient to call back to be scheduled.

## 2021-05-10 NOTE — Telephone Encounter (Signed)
Called and left voicemail for patient to call back to be scheduled. 

## 2021-05-11 NOTE — Telephone Encounter (Signed)
Called and left voicemail for patient to call back to be scheduled. 

## 2021-05-13 NOTE — Telephone Encounter (Signed)
Called and left voicemail for patient to call back to be scheduled. Contacting referring provider about multiple attempts to reach patient were unsuccessful.
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
## 2021-06-02 ENCOUNTER — Other Ambulatory Visit: Payer: Self-pay

## 2021-06-02 ENCOUNTER — Emergency Department (HOSPITAL_COMMUNITY)
Admission: EM | Admit: 2021-06-02 | Discharge: 2021-06-03 | Disposition: A | Discharge status: Home / Self Care | Payer: Medicaid Other | Attending: Emergency Medicine | Admitting: Emergency Medicine

## 2021-06-02 ENCOUNTER — Encounter (HOSPITAL_COMMUNITY): Payer: Self-pay

## 2021-06-02 DIAGNOSIS — Z202 Contact with and (suspected) exposure to infections with a predominantly sexual mode of transmission: Secondary | ICD-10-CM | POA: Diagnosis not present

## 2021-06-02 DIAGNOSIS — Z113 Encounter for screening for infections with a predominantly sexual mode of transmission: Secondary | ICD-10-CM
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
## 2021-06-03 ENCOUNTER — Telehealth (HOSPITAL_COMMUNITY): Payer: Self-pay

## 2021-06-03 ENCOUNTER — Telehealth: Payer: Self-pay | Admitting: *Deleted

## 2021-06-03 LAB — WET PREP, GENITAL
Clue Cells Wet Prep HPF POC: NONE SEEN
Sperm: NONE SEEN
WBC, Wet Prep HPF POC: >=10 — AB (ref <10)

## 2021-06-03 LAB — GC/CHLAMYDIA PROBE AMP (~~LOC~~) NOT AT ARMC
Chlamydia: NEGATIVE
Comment: NEGATIVE
Comment: NORMAL
Neisseria Gonorrhea: NEGATIVE

## 2021-06-03 LAB — RPR: RPR Ser Ql: NONREACTIVE

## 2021-06-03 LAB — HIV ANTIBODY (ROUTINE TESTING W REFLEX): HIV Screen 4th Generation wRfx: NONREACTIVE

## 2021-06-03 LAB — PREGNANCY, URINE: Preg Test, Ur: NEGATIVE

## 2021-06-03 MED ORDER — AZITHROMYCIN 250 MG PO TABS: 1000.0000 mg | ORAL_TABLET | Freq: Every day | ORAL | Status: DC

## 2021-06-03 MED ORDER — METRONIDAZOLE 500 MG PO TABS
2000.0000 mg | ORAL_TABLET | Freq: Once | ORAL | Status: AC
  Administered 2021-06-03: 2000 mg via ORAL
  Filled 2021-06-03: qty 4

## 2021-06-03 MED ORDER — STERILE WATER FOR INJECTION IJ SOLN
INTRAMUSCULAR | Status: AC
  Administered 2021-06-03: 1 mL
  Filled 2021-06-03: qty 10

## 2021-06-03 MED ORDER — AZITHROMYCIN 250 MG PO TABS
1000.0000 mg | ORAL_TABLET | Freq: Once | ORAL | Status: AC
  Administered 2021-06-03: 1000 mg via ORAL
  Filled 2021-06-03: qty 4

## 2021-06-03 MED ORDER — CEFTRIAXONE SODIUM 500 MG IJ SOLR
500.0000 mg | Freq: Once | INTRAMUSCULAR | Status: AC
  Administered 2021-06-03: 500 mg via INTRAMUSCULAR
  Filled 2021-06-03: qty 500
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
## 2021-06-03 NOTE — Telephone Encounter (Signed)
Pt called after viewing results from labs on MyChart wanting to know next steps.  RNCM advised that someone will reach out to her.  RNCM consulted ED Navigator to assist with this matter.
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
## 2021-06-10 ENCOUNTER — Telehealth (HOSPITAL_COMMUNITY): Payer: Self-pay

## 2021-06-13 ENCOUNTER — Encounter (HOSPITAL_COMMUNITY): Payer: Self-pay | Admitting: Emergency Medicine

## 2021-06-13 ENCOUNTER — Emergency Department (HOSPITAL_COMMUNITY)
Admission: EM | Admit: 2021-06-13 | Discharge: 2021-06-13 | Disposition: A | Discharge status: Home / Self Care | Payer: Medicaid Other | Attending: Emergency Medicine | Admitting: Emergency Medicine

## 2021-06-13 DIAGNOSIS — A599 Trichomoniasis, unspecified: Secondary | ICD-10-CM | POA: Diagnosis not present

## 2021-06-13 DIAGNOSIS — B3731 Acute candidiasis of vulva and vagina: Secondary | ICD-10-CM | POA: Insufficient documentation

## 2021-06-13 MED ORDER — FLUCONAZOLE 200 MG PO TABS: 200.0000 mg | ORAL_TABLET | Freq: Every day | ORAL | 0 refills | Status: AC

## 2021-06-13 MED ORDER — METRONIDAZOLE 500 MG PO TABS: 500.0000 mg | ORAL_TABLET | Freq: Two times a day (BID) | ORAL | 0 refills | Status: DC

## 2021-06-13 NOTE — Discharge Instructions (Addendum)
Start treatment with the Flagyl for trich.  You are still having itching after 2 days of Flagyl, take the Diflucan tablet.
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
## 2021-06-13 NOTE — ED Notes (Signed)
Pt stated she does have a burning sensation when peeing. She has mild discomfort in her lower abdomen. Pt came in to receive tx for STI dx.

## 2021-06-13 NOTE — ED Triage Notes (Signed)
Patient here after receiving positive trichomonas result in MyChart app. Patient alert, oriented, and in no apparent distress at this time.

## 2021-06-19 ENCOUNTER — Emergency Department (HOSPITAL_COMMUNITY)
Admission: EM | Admit: 2021-06-19 | Discharge: 2021-06-19 | Disposition: A | Discharge status: Home / Self Care | Payer: Medicaid Other | Attending: Emergency Medicine | Admitting: Emergency Medicine

## 2021-06-19 ENCOUNTER — Encounter (HOSPITAL_COMMUNITY): Payer: Self-pay | Admitting: Emergency Medicine

## 2021-06-19 DIAGNOSIS — N898 Other specified noninflammatory disorders of vagina: Secondary | ICD-10-CM

## 2021-06-19 LAB — URINALYSIS, ROUTINE W REFLEX MICROSCOPIC
Bilirubin Urine: NEGATIVE
Glucose, UA: NEGATIVE mg/dL
Hgb urine dipstick: NEGATIVE
Ketones, ur: NEGATIVE mg/dL
Nitrite: NEGATIVE
Protein, ur: NEGATIVE mg/dL
Specific Gravity, Urine: 1.02 (ref 1.005–1.030)
pH: 6.5 (ref 5.0–8.0)

## 2021-06-19 LAB — WET PREP, GENITAL
Clue Cells Wet Prep HPF POC: NONE SEEN
Sperm: NONE SEEN
Trich, Wet Prep: NONE SEEN
WBC, Wet Prep HPF POC: >=10 — AB (ref <10)
Yeast Wet Prep HPF POC: NONE SEEN

## 2021-06-19 LAB — POC URINE PREG, ED: Preg Test, Ur: NEGATIVE

## 2021-06-19 LAB — URINALYSIS, MICROSCOPIC (REFLEX)

## 2021-06-19 MED ORDER — DOXYCYCLINE HYCLATE 100 MG PO TABS: 100.0000 mg | ORAL_TABLET | Freq: Two times a day (BID) | ORAL | 0 refills | Status: AC

## 2021-06-19 MED ORDER — LIDOCAINE HCL (PF) 1 % IJ SOLN
1.0000 mL | Freq: Once | INTRAMUSCULAR | Status: AC
  Administered 2021-06-19: 1 mL

## 2021-06-19 MED ORDER — CEFTRIAXONE SODIUM 500 MG IJ SOLR
500.0000 mg | Freq: Once | INTRAMUSCULAR | Status: AC
  Administered 2021-06-19: 500 mg via INTRAMUSCULAR
  Filled 2021-06-19: qty 500

## 2021-06-19 NOTE — Discharge Instructions (Signed)
You were tested and treated today for gonorrhea and chlamydia.  No signs of yeast or trichomonas on your testing today.  Gonorrhea and Chlamydia test results typically take 2-3 days to come back, and you will be called by phone with any positive results.  Negative results can also be viewed online through your MyChart account.  Please make sure you complete entire course of doxycycline prescribed to treat chlamydia since you have been exposed.  It is important that you and your partner both complete treatment before you resume sexual activity.  Please make sure you are using protection when sexually active.  You can go to the health department, primary care doctor or gynecologist for any future STD testing needs.

## 2021-06-19 NOTE — ED Triage Notes (Signed)
Pt seen a recently for trichomonas and treated. Pt still having discharge and lower abd pain. No pain at this time.
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
## 2021-06-20 LAB — GC/CHLAMYDIA PROBE AMP (~~LOC~~) NOT AT ARMC
Chlamydia: NEGATIVE
Comment: NEGATIVE
Comment: NORMAL
Neisseria Gonorrhea: NEGATIVE

## 2021-07-20 ENCOUNTER — Encounter (HOSPITAL_COMMUNITY): Payer: Self-pay | Admitting: *Deleted

## 2021-07-20 ENCOUNTER — Emergency Department (HOSPITAL_COMMUNITY)
Admission: EM | Admit: 2021-07-20 | Discharge: 2021-07-20 | Disposition: A | Discharge status: Home / Self Care | Payer: Medicaid Other | Attending: Emergency Medicine | Admitting: Emergency Medicine

## 2021-07-20 ENCOUNTER — Other Ambulatory Visit: Payer: Self-pay

## 2021-07-20 DIAGNOSIS — N760 Acute vaginitis: Secondary | ICD-10-CM | POA: Diagnosis not present

## 2021-07-20 DIAGNOSIS — N898 Other specified noninflammatory disorders of vagina: Secondary | ICD-10-CM

## 2021-07-20 DIAGNOSIS — B9689 Other specified bacterial agents as the cause of diseases classified elsewhere: Secondary | ICD-10-CM | POA: Insufficient documentation

## 2021-07-20 LAB — URINALYSIS, ROUTINE W REFLEX MICROSCOPIC
Bilirubin Urine: NEGATIVE
Glucose, UA: NEGATIVE mg/dL
Hgb urine dipstick: NEGATIVE
Ketones, ur: NEGATIVE mg/dL
Nitrite: NEGATIVE
Protein, ur: NEGATIVE mg/dL
Specific Gravity, Urine: 1.032 — ABNORMAL HIGH (ref 1.005–1.030)
pH: 6 (ref 5.0–8.0)

## 2021-07-20 LAB — WET PREP, GENITAL
Sperm: NONE SEEN
Trich, Wet Prep: NONE SEEN
WBC, Wet Prep HPF POC: <10 (ref <10)
Yeast Wet Prep HPF POC: NONE SEEN

## 2021-07-20 LAB — PREGNANCY, URINE: Preg Test, Ur: NEGATIVE

## 2021-07-20 MED ORDER — CEFTRIAXONE SODIUM 500 MG IJ SOLR
500.0000 mg | Freq: Once | INTRAMUSCULAR | Status: AC
  Administered 2021-07-20: 500 mg via INTRAMUSCULAR
  Filled 2021-07-20: qty 500

## 2021-07-20 MED ORDER — DOXYCYCLINE HYCLATE 100 MG PO CAPS: 100.0000 mg | ORAL_CAPSULE | Freq: Two times a day (BID) | ORAL | 0 refills | Status: AC

## 2021-07-20 MED ORDER — LIDOCAINE HCL (PF) 1 % IJ SOLN
INTRAMUSCULAR | Status: AC
  Filled 2021-07-20: qty 5

## 2021-07-20 MED ORDER — STERILE WATER FOR INJECTION IJ SOLN
INTRAMUSCULAR | Status: AC
  Filled 2021-07-20: qty 10

## 2021-07-20 MED ORDER — METRONIDAZOLE 500 MG PO TABS: 500.0000 mg | ORAL_TABLET | Freq: Two times a day (BID) | ORAL | 0 refills | Status: AC

## 2021-07-20 NOTE — ED Provider Triage Note (Signed)
Emergency Medicine Provider Triage Evaluation Note ? ?Natasha Kelly , a 21 y.o. female  was evaluated in triage.  Pt complains of increased vaginal discharge, some pain with sex for the last 4 to 5 weeks.  Patient reports that boyfriend had tested positive for trichomonas, reports that he was treated but patient is concerned that she may have trichomonas, chlamydia, gonorrhea.  She reports that she has had chlamydia gonorrhea in the past.  She denies any fever, chills, significant abdominal pain, nausea, vomiting.  She reports last menstrual period was 3 weeks ago and normal for her.  She denies any significant dysuria, hematuria.  Patient declines HIV, syphilis testing at this time. ? ?Review of Systems  ?Positive: Vaginal discharge, dyspareunia ?Negative: Dysuria, hematuria, fever, chills, NVD ? ?Physical Exam  ?BP 131/81 (BP Location: Right Arm)   Pulse 89   Temp 98.9 ?F (37.2 ?C) (Oral)   Resp 14   SpO2 100%  ?Gen:   Awake, no distress   ?Resp:  Normal effort  ?MSK:   Moves extremities without difficulty  ?Other:   ? ?Medical Decision Making  ?Medically screening exam initiated at 2:49 PM.  Appropriate orders placed.  Natasha Kelly was informed that the remainder of the evaluation will be completed by another provider, this initial triage assessment does not replace that evaluation, and the importance of remaining in the ED until their evaluation is complete. ? ?Workup initiated, patient will need pelvic exam ?  ?Natasha Floss, PA-C ?07/20/21 1450 ? ?
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
## 2021-07-20 NOTE — Discharge Instructions (Addendum)
You are seen in the emergency department today for vaginal discharge.  You have a condition called bacterial vaginosis which is an overgrowth of bacteria and not STD.  It does require antibiotics.  I am prescribing you metronidazole 500 mg twice a day for the next 7 days.  You have been prescribed an antibiotic called metronidazole, also known as Flagyl. Some side effects of metronidazole you should be aware of are nausea and headache. Take the medication with food to minimize stomach upset. It is imperative that you do not drink alcohol while taking this antibiotic. Please take the medication as prescribed. Please take the medication until your antibiotic course is complete.  Additionally we are treating you as if you have gonorrhea and chlamydia.  You received a shot here and you are being discharged with doxycycline which she will take twice a day for the next 7 days.  Please return to the emergency department if you have worsening abdominal pain with fever.  Otherwise please follow up with your primary care provider. ?

## 2021-07-20 NOTE — ED Triage Notes (Signed)
Pt reports having vaginal discharge for two weeks. Denies any pain. No distress noted at triage. ?

## 2021-07-21 LAB — GC/CHLAMYDIA PROBE AMP (~~LOC~~) NOT AT ARMC
Chlamydia: NEGATIVE
Comment: NEGATIVE
Comment: NORMAL
Neisseria Gonorrhea: NEGATIVE

## 2021-08-14 ENCOUNTER — Emergency Department (HOSPITAL_COMMUNITY)
Admission: EM | Admit: 2021-08-14 | Discharge: 2021-08-14 | Disposition: A | Discharge status: Home / Self Care | Payer: Medicaid Other | Attending: Emergency Medicine | Admitting: Emergency Medicine

## 2021-08-14 ENCOUNTER — Encounter (HOSPITAL_COMMUNITY): Payer: Self-pay | Admitting: Emergency Medicine

## 2021-08-14 ENCOUNTER — Other Ambulatory Visit: Payer: Self-pay

## 2021-08-14 DIAGNOSIS — N898 Other specified noninflammatory disorders of vagina: Secondary | ICD-10-CM | POA: Diagnosis present

## 2021-08-14 DIAGNOSIS — B9689 Other specified bacterial agents as the cause of diseases classified elsewhere: Secondary | ICD-10-CM | POA: Diagnosis not present

## 2021-08-14 DIAGNOSIS — N760 Acute vaginitis: Secondary | ICD-10-CM | POA: Diagnosis not present

## 2021-08-14 HISTORY — DX: Other specified bacterial agents as the cause of diseases classified elsewhere: B96.89

## 2021-08-14 HISTORY — DX: Acute vaginitis: N76.0

## 2021-08-14 LAB — URINALYSIS, ROUTINE W REFLEX MICROSCOPIC
Bacteria, UA: NONE SEEN
Bilirubin Urine: NEGATIVE
Glucose, UA: NEGATIVE mg/dL
Hgb urine dipstick: NEGATIVE
Ketones, ur: NEGATIVE mg/dL
Nitrite: NEGATIVE
Protein, ur: NEGATIVE mg/dL
Specific Gravity, Urine: 1.027 (ref 1.005–1.030)
pH: 5 (ref 5.0–8.0)

## 2021-08-14 LAB — PREGNANCY, URINE: Preg Test, Ur: NEGATIVE

## 2021-08-14 LAB — WET PREP, GENITAL
Sperm: NONE SEEN
Trich, Wet Prep: NONE SEEN
WBC, Wet Prep HPF POC: >=10 — AB (ref <10)
Yeast Wet Prep HPF POC: NONE SEEN

## 2021-08-14 MED ORDER — METRONIDAZOLE 0.75 % VA GEL: Freq: Every day | VAGINAL | 0 refills | Status: AC

## 2021-08-14 NOTE — Discharge Instructions (Signed)
You do not have a urinary tract infection.  You still have BV, read the information about this attached your discharge papers. ? ?I have sent antibiotic gel to your pharmacy for treatment of this.  For any further STD concerns, follow-up with the [place]. ?

## 2021-08-14 NOTE — ED Triage Notes (Signed)
C/o "white crunchy" vaginal discharge x 1 week with lower abd pain.  States she was seen in ED  3-4 weeks ago and treated for BV.  Denies nausea and vomiting. ?

## 2021-08-14 NOTE — ED Provider Notes (Signed)
?[facility] EMERGENCY DEPARTMENT ?Provider Note ? ? ?CSN: [id number] ?Arrival date & time: 08/14/21  1215 ? ?  ? ?History ? ?Chief Complaint  ?Patient presents with  ? Vaginal Discharge  ? ? ?Natasha Kelly is a 21 y.o. female presenting with a complaint of "crunchy discharge."  Says that this has been going on for about a week.  Last week she was diagnosed with bacterial vaginosis and given metronidazole.  She says that she finished this course of antibiotics.  Denies any abnormal vaginal bleeding or cramping but does note that she has been sexually active since her diagnosis of bacterial vaginosis.  Also endorses some dysuria. ? ? ?Home Medications ?Prior to Admission medications   ?Medication Sig Start Date End Date Taking? Authorizing Provider  ?ferrous sulfate 325 (65 FE) MG tablet Take 1 tablet (325 mg total) by mouth every other day. ?Patient not taking: Reported on 06/02/2021 09/07/20 10/07/20  Alric Seton, MD  ?   ? ?Allergies    ?Patient has no known allergies.   ? ?Review of Systems   ?Review of Systems  ?Genitourinary:  Positive for vaginal discharge.  ? ?Physical Exam ?Updated Vital Signs ?BP 122/78 (BP Location: Right Arm)   Pulse 96   Temp 98.9 ?F (37.2 ?C) (Oral)   Resp 16   LMP 07/10/2021   SpO2 100%  ?Physical Exam ?Vitals and nursing note reviewed.  ?Constitutional:   ?   Appearance: Normal appearance.  ?HENT:  ?   Head: Normocephalic and atraumatic.  ?Eyes:  ?   General: No scleral icterus. ?   Conjunctiva/sclera: Conjunctivae normal.  ?Pulmonary:  ?   Effort: Pulmonary effort is normal. No respiratory distress.  ?Abdominal:  ?   Tenderness: There is abdominal tenderness (Suprapubic). There is no guarding.  ?Genitourinary: ?   Comments: GU exam deferred, self swabs ?Skin: ?   Findings: No rash.  ?Neurological:  ?   Mental Status: She is alert.  ?Psychiatric:     ?   Mood and Affect: Mood normal.  ? ? ?ED Results / Procedures / Treatments   ?Labs ?(all labs ordered are  listed, but only abnormal results are displayed) ?Labs Reviewed  ?WET PREP, GENITAL - Abnormal; Notable for the following components:  ?    Result Value  ? Clue Cells Wet Prep HPF POC PRESENT (*)   ? WBC, Wet Prep HPF POC >=10 (*)   ? All other components within normal limits  ?URINALYSIS, ROUTINE W REFLEX MICROSCOPIC - Abnormal; Notable for the following components:  ? APPearance CLOUDY (*)   ? Leukocytes,Ua LARGE (*)   ? All other components within normal limits  ?PREGNANCY, URINE  ?GC/CHLAMYDIA PROBE AMP ([place]) NOT AT [facility]  ? ? ?EKG ?None ? ?Radiology ?No results found. ? ?Procedures ?Procedures  ? ?Medications Ordered in ED ?Medications - No data to display ? ?ED Course/ Medical Decision Making/ A&P ?  ?                        ?Medical Decision Making ?Amount and/or Complexity of Data Reviewed ?Labs: ordered. ? ? ?21 year old female presenting with the concern for vaginal discharge.  Says this has been going on for a week.  Per chart review, patient was diagnosed with bacterial vaginosis on 3/8.  She says that it got better somewhat and she started to have symptoms again last week. ? ?Patient self swabbed.  STD testing was negative at that time  and patient's vital signs are stable, low suspicion for PID/TOA. ? ?Testing: Wet prep with BV, no yeast.  UA negative for infection ? ?MDM/disposition: Low suspicion emergent causes of her symptoms.  Will be discharged home with metronidazole gel in the event that she failed oral treatment.  She has been referred to the health department due to her many visits for STD related concerns. ? ?Final Clinical Impression(s) / ED Diagnoses ?Final diagnoses:  ?BV (bacterial vaginosis)  ? ? ?Rx / DC Orders ?ED Discharge Orders   ? ?      Ordered  ?  metroNIDAZOLE (METROGEL) 0.75 % vaginal gel  Daily at bedtime       ? 08/14/21 1428  ? ?  ?  ? ?  ? ?Results and diagnoses were explained to the patient. Return precautions discussed in full. Patient had no additional questions  and expressed complete understanding. ? ? ?This chart was dictated using voice recognition software.  Despite best efforts to proofread,  errors can occur which can change the documentation meaning.  ?  ?Saddie Benders, PA-C ?08/14/21 1449 ? ?  ?Gloris Manchester, MD ?08/15/21 0024 ? ?

## 2021-08-15 LAB — GC/CHLAMYDIA PROBE AMP (~~LOC~~) NOT AT ARMC
Chlamydia: NEGATIVE
Comment: NEGATIVE
Comment: NORMAL
Neisseria Gonorrhea: NEGATIVE

## 2021-08-22 ENCOUNTER — Other Ambulatory Visit: Payer: Self-pay

## 2021-08-22 ENCOUNTER — Encounter (HOSPITAL_COMMUNITY): Payer: Self-pay

## 2021-08-22 ENCOUNTER — Emergency Department (HOSPITAL_COMMUNITY)
Admission: EM | Admit: 2021-08-22 | Discharge: 2021-08-22 | Disposition: A | Discharge status: Home / Self Care | Payer: Medicaid Other | Attending: Emergency Medicine | Admitting: Emergency Medicine

## 2021-08-22 DIAGNOSIS — B379 Candidiasis, unspecified: Secondary | ICD-10-CM | POA: Diagnosis not present

## 2021-08-22 DIAGNOSIS — B3731 Acute candidiasis of vulva and vagina: Secondary | ICD-10-CM

## 2021-08-22 DIAGNOSIS — N898 Other specified noninflammatory disorders of vagina: Secondary | ICD-10-CM

## 2021-08-22 LAB — WET PREP, GENITAL
Clue Cells Wet Prep HPF POC: NONE SEEN
Sperm: NONE SEEN
Trich, Wet Prep: NONE SEEN
WBC, Wet Prep HPF POC: <10 (ref <10)

## 2021-08-22 LAB — URINALYSIS, ROUTINE W REFLEX MICROSCOPIC
Bilirubin Urine: NEGATIVE
Glucose, UA: NEGATIVE mg/dL
Hgb urine dipstick: NEGATIVE
Ketones, ur: NEGATIVE mg/dL
Leukocytes,Ua: NEGATIVE
Nitrite: NEGATIVE
Protein, ur: NEGATIVE mg/dL
Specific Gravity, Urine: 1.024 (ref 1.005–1.030)
pH: 6 (ref 5.0–8.0)

## 2021-08-22 LAB — PREGNANCY, URINE: Preg Test, Ur: NEGATIVE

## 2021-08-22 MED ORDER — FLUCONAZOLE 150 MG PO TABS: 150.0000 mg | ORAL_TABLET | Freq: Every day | ORAL | 0 refills | Status: DC

## 2021-08-22 NOTE — ED Triage Notes (Signed)
Pt reports being recently dx with BV and has taken for two different medications, of which she cannot remember the names, without improvement. Endorses continued discharge and abdominal cramping.  ?

## 2021-08-22 NOTE — ED Notes (Signed)
Patient was able to self swab with instructions from this RN. Both ordered swabs sent to lab via tube station at this time.  ?

## 2021-08-22 NOTE — Discharge Instructions (Signed)
Your history, exam, work-up today confirmed yeast infection as a cause of your symptoms.  Your urine did not show some infection and you did not have any further BV as we discussed.  We initially offered pelvic exam given the lower abdominal pain however you did not want it at this time.  If any symptoms were to change or worsen, please return to the nearest emergency department.  Please take the dose of antibiotics to treat the yeast infection. ?

## 2021-08-22 NOTE — ED Provider Notes (Signed)
?[facility] EMERGENCY DEPARTMENT ?Provider Note ? ? ?CSN: [id number] ?Arrival date & time: 08/22/21  1721 ? ?  ? ?History ? ?No chief complaint on file. ? ? ?Natasha Kelly is a 21 y.o. female. ? ?The history is provided by the patient and medical records. No language interpreter was used.  ?Vaginal Discharge ?Quality:  Yellow and white ?Severity:  Moderate ?Onset quality:  Gradual ?Duration:  1 month ?Timing:  Constant ?Progression:  Waxing and waning ?Chronicity:  Recurrent ?Relieved by:  Nothing ?Worsened by:  Nothing ?Ineffective treatments:  Prescription medications ?Associated symptoms: abdominal pain   ?Associated symptoms: no dysuria, no fever, no genital lesions, no nausea, no rash, no urinary frequency, no urinary hesitancy, no urinary incontinence, no vaginal itching and no vomiting   ? ?  ? ?Home Medications ?Prior to Admission medications   ?Medication Sig Start Date End Date Taking? Authorizing Provider  ?ferrous sulfate 325 (65 FE) MG tablet Take 1 tablet (325 mg total) by mouth every other day. ?Patient not taking: Reported on 06/02/2021 09/07/20 10/07/20  Alric Seton, MD  ?   ? ?Allergies    ?Patient has no known allergies.   ? ?Review of Systems   ?Review of Systems  ?Constitutional:  Negative for chills, fatigue and fever.  ?HENT:  Negative for congestion.   ?Respiratory:  Negative for cough, chest tightness, shortness of breath and wheezing.   ?Cardiovascular:  Negative for chest pain and palpitations.  ?Gastrointestinal:  Positive for abdominal pain. Negative for constipation, diarrhea, nausea and vomiting.  ?Genitourinary:  Positive for vaginal discharge. Negative for bladder incontinence, difficulty urinating, dysuria, flank pain, frequency and hesitancy.  ?Musculoskeletal:  Negative for back pain.  ?Skin:  Negative for rash and wound.  ?Neurological:  Negative for headaches.  ?Psychiatric/Behavioral:  Negative for agitation.   ?All other systems reviewed and are  negative. ? ?Physical Exam ?Updated Vital Signs ?BP 127/78   Pulse 98   Temp 98.4 ?F (36.9 ?C)   Resp 16   SpO2 100%  ?Physical Exam ?Vitals and nursing note reviewed.  ?Constitutional:   ?   General: She is not in acute distress. ?   Appearance: She is well-developed. She is not ill-appearing, toxic-appearing or diaphoretic.  ?HENT:  ?   Head: Normocephalic and atraumatic.  ?Eyes:  ?   Conjunctiva/sclera: Conjunctivae normal.  ?Cardiovascular:  ?   Rate and Rhythm: Normal rate and regular rhythm.  ?   Heart sounds: No murmur heard. ?Pulmonary:  ?   Effort: Pulmonary effort is normal. No respiratory distress.  ?   Breath sounds: Normal breath sounds. No wheezing, rhonchi or rales.  ?Chest:  ?   Chest wall: No tenderness.  ?Abdominal:  ?   Palpations: Abdomen is soft.  ?   Tenderness: There is no abdominal tenderness. There is no right CVA tenderness, left CVA tenderness, guarding or rebound.  ?Genitourinary: ?   Comments: Refused GU exam ?Musculoskeletal:     ?   General: No swelling or tenderness.  ?   Cervical back: Neck supple. No tenderness.  ?   Right lower leg: No edema.  ?   Left lower leg: No edema.  ?Skin: ?   General: Skin is warm and dry.  ?   Capillary Refill: Capillary refill takes less than 2 seconds.  ?Neurological:  ?   Mental Status: She is alert.  ?Psychiatric:     ?   Mood and Affect: Mood normal.  ? ? ?ED Results /  Procedures / Treatments   ?Labs ?(all labs ordered are listed, but only abnormal results are displayed) ?Labs Reviewed  ?WET PREP, GENITAL - Abnormal; Notable for the following components:  ?    Result Value  ? Yeast Wet Prep HPF POC PRESENT (*)   ? All other components within normal limits  ?URINALYSIS, ROUTINE W REFLEX MICROSCOPIC  ?PREGNANCY, URINE  ?GC/CHLAMYDIA PROBE AMP ([place]) NOT AT [facility]  ? ? ?EKG ?None ? ?Radiology ?No results found. ? ?Procedures ?Procedures  ? ? ?Medications Ordered in ED ?Medications - No data to display ? ?ED Course/ Medical Decision Making/  A&P ?  ?                        ?Medical Decision Making ? ? ?Natasha Kelly is a 21 y.o. female with a past medical history significant for previous STI who presents with continued vaginal discharge and some lower abdominal discomfort.  According to patient, she has been diagnosed with BV for the last month and has been treated both with STI medications with Rocephin, doxycycline, and Flagyl 1 month ago followed by another course of intravaginal Flagyl 1 week ago for continued discharge and BV.  Patient was negative for gonorrhea and chlamydia and trichomonas during her previous swabs over the last month.  Patient reports that she is still having discharge and has not had any other sexual encounters since her BV diagnosis.  She denies fevers, chills, chest pain, shortness breath, nausea, vomiting.  She denies constipation or diarrhea.  Denies any dysuria.  Reports she is having some lower abdominal cramping at times but is not constant.  She is not having pain currently but reports she had some this morning.  She reports her discharge was previously clear and now it is more yellow and she wanted to make sure she did not develop a yeast infection on top of the suspected continued BV. ? ?Patient denies any trauma, chest pain, short of breath, or other acute infectious symptoms. ? ?On exam, lungs clear and chest nontender.  Abdomen was nontender on my exam.  Normal bowel sounds.  Patient refused pelvic exam by me as she reports she has had several of them in the last month.  She had no back tenderness or flank tenderness and was otherwise resting comfortably. ? ?Clinically I suspect the patient does have continued BV given her symptoms however given the pain and change in the discharge quality, we recommended doing a pelvic exam to assess for adnexal tenderness or cervical motion tenderness and if it was tender, would likely get ultrasound to rule out PID however patient does not want this.  Patient wants to self swab  to make sure she does not have BV and will importantly does not have a new yeast infection.  This was felt to be reasonable given her lack of pain currently and her insistence on this plan.  Patient understands the risks of missing exam findings such as the tenderness on pelvic exam and the ultrasound.  She does not think she has abscess or PID/TOA at this time ? ?If she does have BV, anticipate switching to a different antibiotic. ? ?Anticipate reassessment after self swab. ? ?11:15 PM ?Self swab returned showing no evidence of BV as it appears to have cleared up but does show evidence of yeast infection.  Patient suspected this.  We will give her a prescription for Diflucan dose which she has tolerated in the past and she  will follow-up with her PCP and OB/GYN.  Urinalysis did not show evidence of UTI.  She agrees with plan of care and will be discharged.  ? ? ? ? ? ? ? ?Final Clinical Impression(s) / ED Diagnoses ?Final diagnoses:  ?Vaginal yeast infection  ?Vaginal discharge  ? ? ?Rx / DC Orders ?ED Discharge Orders   ? ?      Ordered  ?  fluconazole (DIFLUCAN) 150 MG tablet  Daily       ? 08/22/21 2317  ? ?  ?  ? ?  ? ? ?Clinical Impression: ?1. Vaginal yeast infection   ?2. Vaginal discharge   ? ? ?Disposition: Discharge ? ?Condition: Good ? ?I have discussed the results, Dx and Tx plan with the pt(& family if present). He/she/they expressed understanding and agree(s) with the plan. Discharge instructions discussed at great length. Strict return precautions discussed and pt &/or family have verbalized understanding of the instructions. No further questions at time of discharge.  ? ? ?New Prescriptions  ? FLUCONAZOLE (DIFLUCAN) 150 MG TABLET    Take 1 tablet (150 mg total) by mouth daily.  ? ? ?Follow Up: ?your OBGYN ? ? ? ? ?[facility] ?[address] ?[place] [id number] ?[phone] ?Schedule an appointment as soon as possible for a visit   ? ? ?[facility] ?[address] ?[id number] mc ?[place] [id number] ?[phone] ? ? ? ? ? ?  ?Takeru Bose, Canary Brimhristopher J, MD ?08/22/21 2318 ? ?

## 2021-08-22 NOTE — ED Notes (Signed)
All discharge instructions including follow up care and prescriptions reviewed with patient and patient verbalized understanding of same. Patient stable and ambulatory at time of discharge.  

## 2021-08-22 NOTE — ED Provider Triage Note (Signed)
Emergency Medicine Provider Triage Evaluation Note ? ?Natasha Kelly , a 21 y.o. female  was evaluated in triage.  Pt complains of ongoing vaginal discharge. Pt recently diagnosed with BV in March - discharged with Flagyl. Reports compliance with same without improvement. Seen again on 04/02 and discharged with Metronidazole cream. States that it has not been helping and she is now having lower abd pain. States she has not been sexually active since she was last seen however is sexually active with 1 partner.  ? ?Review of Systems  ?Positive: + vaginal discharge, abd pain ?Negative:  ? ?Physical Exam  ?BP 127/78   Pulse 98   Temp 98.4 ?F (36.9 ?C)   Resp 16   SpO2 100%  ?Gen:   Awake, no distress   ?Resp:  Normal effort  ?MSK:   Moves extremities without difficulty  ?Other:   ? ?Medical Decision Making  ?Medically screening exam initiated at 5:57 PM.  Appropriate orders placed.  Natasha Kelly was informed that the remainder of the evaluation will be completed by another provider, this initial triage assessment does not replace that evaluation, and the importance of remaining in the ED until their evaluation is complete. ? ? ?  ?Tanda Rockers, PA-C ?08/22/21 1758 ? ?

## 2021-08-23 LAB — GC/CHLAMYDIA PROBE AMP (~~LOC~~) NOT AT ARMC
Chlamydia: NEGATIVE
Comment: NEGATIVE
Comment: NORMAL
Neisseria Gonorrhea: NEGATIVE

## 2021-09-02 ENCOUNTER — Inpatient Hospital Stay (HOSPITAL_COMMUNITY)
Admission: AD | Admit: 2021-09-02 | Discharge: 2021-09-02 | Disposition: A | Discharge status: Home / Self Care | Payer: Medicaid Other | Attending: Obstetrics and Gynecology | Admitting: Obstetrics and Gynecology

## 2021-09-02 ENCOUNTER — Inpatient Hospital Stay (HOSPITAL_COMMUNITY): Payer: Medicaid Other

## 2021-09-02 ENCOUNTER — Encounter (HOSPITAL_COMMUNITY): Payer: Self-pay | Admitting: *Deleted

## 2021-09-02 DIAGNOSIS — O26891 Other specified pregnancy related conditions, first trimester: Secondary | ICD-10-CM | POA: Insufficient documentation

## 2021-09-02 DIAGNOSIS — O3680X0 Pregnancy with inconclusive fetal viability, not applicable or unspecified: Secondary | ICD-10-CM | POA: Diagnosis not present

## 2021-09-02 DIAGNOSIS — Z114 Encounter for screening for human immunodeficiency virus [HIV]: Secondary | ICD-10-CM | POA: Diagnosis not present

## 2021-09-02 DIAGNOSIS — Z3A01 Less than 8 weeks gestation of pregnancy: Secondary | ICD-10-CM | POA: Diagnosis not present

## 2021-09-02 DIAGNOSIS — R109 Unspecified abdominal pain: Secondary | ICD-10-CM | POA: Diagnosis not present

## 2021-09-02 DIAGNOSIS — Z113 Encounter for screening for infections with a predominantly sexual mode of transmission: Secondary | ICD-10-CM | POA: Insufficient documentation

## 2021-09-02 DIAGNOSIS — O209 Hemorrhage in early pregnancy, unspecified: Secondary | ICD-10-CM | POA: Diagnosis not present

## 2021-09-02 LAB — CBC
HCT: 30 % — ABNORMAL LOW (ref 36.0–46.0)
Hemoglobin: 9.1 g/dL — ABNORMAL LOW (ref 12.0–15.0)
MCH: 20.5 pg — ABNORMAL LOW (ref 26.0–34.0)
MCHC: 30.3 g/dL (ref 30.0–36.0)
MCV: 67.6 fL — ABNORMAL LOW (ref 80.0–100.0)
Platelets: 485 10*3/uL — ABNORMAL HIGH (ref 150–400)
RBC: 4.44 MIL/uL (ref 3.87–5.11)
RDW: 19.8 % — ABNORMAL HIGH (ref 11.5–15.5)
WBC: 5.2 10*3/uL (ref 4.0–10.5)
nRBC: 0 % (ref 0.0–0.2)

## 2021-09-02 LAB — URINALYSIS, ROUTINE W REFLEX MICROSCOPIC
Bilirubin Urine: NEGATIVE
Glucose, UA: NEGATIVE mg/dL
Hgb urine dipstick: NEGATIVE
Ketones, ur: NEGATIVE mg/dL
Nitrite: NEGATIVE
Protein, ur: NEGATIVE mg/dL
Specific Gravity, Urine: 1.009 (ref 1.005–1.030)
pH: 6 (ref 5.0–8.0)

## 2021-09-02 LAB — WET PREP, GENITAL
Clue Cells Wet Prep HPF POC: NONE SEEN
Sperm: NONE SEEN
Trich, Wet Prep: NONE SEEN
WBC, Wet Prep HPF POC: <10 (ref <10)

## 2021-09-02 LAB — POCT PREGNANCY, URINE: Preg Test, Ur: POSITIVE — AB

## 2021-09-02 LAB — HCG, QUANTITATIVE, PREGNANCY: hCG, Beta Chain, Quant, S: 7209 m[IU]/mL — ABNORMAL HIGH (ref <5)

## 2021-09-02 LAB — HIV ANTIBODY (ROUTINE TESTING W REFLEX): HIV Screen 4th Generation wRfx: NONREACTIVE

## 2021-09-02 MED ORDER — TERCONAZOLE 0.4 % VA CREA: 1.0000 | TOPICAL_CREAM | Freq: Every day | VAGINAL | 0 refills | Status: DC

## 2021-09-02 MED ORDER — FERROUS SULFATE 325 (65 FE) MG PO TABS: 325.0000 mg | ORAL_TABLET | ORAL | 0 refills | Status: DC

## 2021-09-02 MED ORDER — VITAMIN C 250 MG PO TABS: 250.0000 mg | ORAL_TABLET | Freq: Every day | ORAL | 0 refills | Status: DC

## 2021-09-02 NOTE — MAU Note (Addendum)
Natasha Kelly is a 21 y.o. at Unknown here in MAU reporting: +HPT on 4/19. Was having light bleeding started today. Having cramping ?LMP: ? Jan - initial test was neg. Neg in hosp ~10days ago, +HPT this week.  Unable to pee in triage. ?Onset of complaint: scant bleeding ?Pain score: mild cramps ?Vitals:  ? 09/02/21 1054  ?BP: 119/73  ?Pulse: 93  ?Resp: 16  ?Temp: 99.1 ?F (37.3 ?C)  ?SpO2: 100%  ?   ? ?Lab orders placed from triage:  UPT ?

## 2021-09-02 NOTE — MAU Provider Note (Signed)
?History  ?  ? ?CSN: [id number] ? ?Arrival date and time: 09/02/21 1029 ? ? None  ?  ? ?Chief Complaint  ?Patient presents with  ? Possible Pregnancy  ? Vaginal Bleeding  ? Abdominal Pain  ? ?Natasha Kelly is a 21 y.o. year old [id number] female at [redacted]w[redacted]d weeks gestation who presents to MAU reporting no period since about February 2023. She had  (+) HPT on 08/31/2021 and had a (-) UPT 10 days ago. She reports now having scant VB. She denies any recent SI. ? ? ?OB History   ? ? Gravida  ?2  ? Para  ?   ? Term  ?   ? Preterm  ?   ? AB  ?1  ? Living  ?   ?  ? ? SAB  ?1  ? IAB  ?   ? Ectopic  ?   ? Multiple  ?   ? Live Births  ?   ?   ?  ?  ? ? ?Past Medical History:  ?Diagnosis Date  ? Bacterial vaginosis   ? Medical history non-contributory   ? ? ?Past Surgical History:  ?Procedure Laterality Date  ? NO PAST SURGERIES    ? ? ?Family History  ?Problem Relation Age of Onset  ? Healthy Mother   ? Healthy Father   ? ? ?Social History  ? ?Tobacco Use  ? Smoking status: Never  ? Smokeless tobacco: Never  ?Vaping Use  ? Vaping Use: Never used  ?Substance Use Topics  ? Alcohol use: Never  ? Drug use: Never  ? ? ?Allergies: No Known Allergies ? ?Medications Prior to Admission  ?Medication Sig Dispense Refill Last Dose  ? ferrous sulfate 325 (65 FE) MG tablet Take 1 tablet (325 mg total) by mouth every other day. (Patient not taking: Reported on 06/02/2021)  0   ? fluconazole (DIFLUCAN) 150 MG tablet Take 1 tablet (150 mg total) by mouth daily. 1 tablet 0   ? ? ?Review of Systems  ?Constitutional: Negative.   ?HENT: Negative.    ?Eyes: Negative.   ?Respiratory: Negative.    ?Cardiovascular: Negative.   ?Gastrointestinal: Negative.   ?Endocrine: Negative.   ?Genitourinary:  Positive for vaginal bleeding (small bleeding; none now).  ?Musculoskeletal: Negative.   ?Skin: Negative.   ?Allergic/Immunologic: Negative.   ?Neurological: Negative.   ?Hematological: Negative.   ?Psychiatric/Behavioral: Negative.    ? ?Physical Exam  ? ?Blood  pressure 119/73, pulse 93, temperature 99.1 ?F (37.3 ?C), resp. rate 16, height 5\' 2"  (1.575 m), weight 61.6 kg, last menstrual period 07/10/2021, SpO2 100 %, unknown if currently breastfeeding. ? ?Physical Exam ?Vitals and nursing note reviewed. Exam conducted with a chaperone present.  ?Constitutional:   ?   Appearance: Normal appearance. She is normal weight.  ?Cardiovascular:  ?   Rate and Rhythm: Normal rate.  ?   Pulses: Normal pulses.  ?Pulmonary:  ?   Effort: Pulmonary effort is normal.  ?Abdominal:  ?   Palpations: Abdomen is soft.  ?Genitourinary: ?   Comments: Wet prep and GC/CT collected using blind swab by patient ?Musculoskeletal:     ?   General: Normal range of motion.  ?Skin: ?   General: Skin is warm and dry.  ?Neurological:  ?   Mental Status: She is alert and oriented to person, place, and time.  ?Psychiatric:     ?   Mood and Affect: Mood normal.     ?   Behavior: Behavior  normal.     ?   Thought Content: Thought content normal.     ?   Judgment: Judgment normal.  ? ? ?MAU Course  ?Procedures ? ?MDM ?CCUA ?UPT ?CBC ?ABO/Rh ?HCG ?Wet Prep ?GC/CT -- pending ?HIV -- pending ?OB < 14 wks [place] with TV ? ?Results for orders placed or performed during the hospital encounter of 09/02/21 (from the past 24 hour(s))  ?Pregnancy, urine POC     Status: Abnormal  ? Collection Time: 09/02/21 11:08 AM  ?Result Value Ref Range  ? Preg Test, Ur POSITIVE (A) NEGATIVE  ?Urinalysis, Routine w reflex microscopic     Status: Abnormal  ? Collection Time: 09/02/21 11:12 AM  ?Result Value Ref Range  ? Color, Urine STRAW (A) YELLOW  ? APPearance CLEAR CLEAR  ? Specific Gravity, Urine 1.009 1.005 - 1.030  ? pH 6.0 5.0 - 8.0  ? Glucose, UA NEGATIVE NEGATIVE mg/dL  ? Hgb urine dipstick NEGATIVE NEGATIVE  ? Bilirubin Urine NEGATIVE NEGATIVE  ? Ketones, ur NEGATIVE NEGATIVE mg/dL  ? Protein, ur NEGATIVE NEGATIVE mg/dL  ? Nitrite NEGATIVE NEGATIVE  ? Leukocytes,Ua SMALL (A) NEGATIVE  ? RBC / HPF 0-5 0 - 5 RBC/hpf  ? WBC, UA 0-5 0  - 5 WBC/hpf  ? Bacteria, UA RARE (A) NONE SEEN  ? Squamous Epithelial / LPF 0-5 0 - 5  ?CBC     Status: Abnormal  ? Collection Time: 09/02/21 11:21 AM  ?Result Value Ref Range  ? WBC 5.2 4.0 - 10.5 K/uL  ? RBC 4.44 3.87 - 5.11 MIL/uL  ? Hemoglobin 9.1 (L) 12.0 - 15.0 g/dL  ? HCT 30.0 (L) 36.0 - 46.0 %  ? MCV 67.6 (L) 80.0 - 100.0 fL  ? MCH 20.5 (L) 26.0 - 34.0 pg  ? MCHC 30.3 30.0 - 36.0 g/dL  ? RDW 19.8 (H) 11.5 - 15.5 %  ? Platelets 485 (H) 150 - 400 K/uL  ? nRBC 0.0 0.0 - 0.2 %  ?hCG, quantitative, pregnancy     Status: Abnormal  ? Collection Time: 09/02/21 11:21 AM  ?Result Value Ref Range  ? hCG, Beta Chain, Quant, S 7,209 (H) <5 mIU/mL  ?HIV Antibody (routine testing w rflx)     Status: None  ? Collection Time: 09/02/21 11:21 AM  ?Result Value Ref Range  ? HIV Screen 4th Generation wRfx Non Reactive Non Reactive  ?Wet prep, genital     Status: Abnormal  ? Collection Time: 09/02/21 11:31 AM  ? Specimen: Vaginal  ?Result Value Ref Range  ? Yeast Wet Prep HPF POC PRESENT (A) NONE SEEN  ? Trich, Wet Prep NONE SEEN NONE SEEN  ? Clue Cells Wet Prep HPF POC NONE SEEN NONE SEEN  ? WBC, Wet Prep HPF POC <10 <10  ? Sperm NONE SEEN   ? ? ?US OB LESS THAN 14 WEEKS WITH OB TRANSVAGINAL ? ?Result Date: 09/02/2021 ?CLINICAL DATA:  Vaginal bleeding EXAM: OBSTETRIC <14 WK [place] AND TRANSVAGINAL OB US TECHNIQUE: Both transabdominal and transvaginal ultrasound examinations were performed for complete evaluation of the gestation as well as the maternal uterus, adnexal regions, and pelvic cul-de-sac. Transvaginal technique was performed to assess early pregnancy. COMPARISON:  None. FINDINGS: Intrauterine gestational sac: Single Yolk sac:  Not Visualized. Embryo:  Not Visualized. Cardiac Activity: Not Visualized. MSD: 7.0 mm   5 w   2 d Subchorionic hemorrhage:  None visualized. Maternal uterus/adnexae: Bilateral ovaries and adnexal regions within normal limits. No free fluid within the pelvis.  IMPRESSION: Probable early intrauterine  gestational sac, but no yolk sac, fetal pole, or cardiac activity yet visualized. Recommend follow-up quantitative B-HCG levels and follow-up US in 14 days to assess viability. This recommendation follows SRU consensus guidelines: Diagnostic Criteria for Nonviable Pregnancy Early in the First Trimester. [employer] Med [id number]:[id number]. Electronically Signed   By: Davina Poke D.O.   On: 09/02/2021 12:15   ?  ? ?Assessment and Plan  ?Pregnancy of unknown anatomic location ?- Return in 48 hours for repeat HCG level on Sunday 09/04/2021 ? ?Vaginal bleeding in pregnancy, first trimester ?- Return to MAU: ?If you have heavier bleeding that soaks through more that 2 pads per hour for an hour or more ?If you bleed so much that you feel like you might pass out or you do pass out ?If you have significant abdominal pain that is not improved with Tylenol 1000 mg every 8 hours as needed for pain ?If you develop a fever > 100.5  ? ?[redacted] weeks gestation of pregnancy  ? ?- Discharge patient ?- Scheduled to return to MAU on Sunday 09/04/2021 for repeat HCG level ?- Patient verbalized an understanding of the plan of care and agrees.  ? ?Laury Deep, CNM ?09/02/2021, 2:10 PM  ?

## 2021-09-02 NOTE — Discharge Instructions (Signed)
Return to MAU: If you have heavier bleeding that soaks through more that 2 pads per hour for an hour or more If you bleed so much that you feel like you might pass out or you do pass out If you have significant abdominal pain that is not improved with Tylenol 1000 mg every 8 hours as needed for pain If you develop a fever > 100.5  

## 2021-09-04 ENCOUNTER — Other Ambulatory Visit (HOSPITAL_COMMUNITY): Payer: Medicaid Other

## 2021-09-04 ENCOUNTER — Encounter (HOSPITAL_COMMUNITY): Payer: Self-pay

## 2021-09-04 ENCOUNTER — Inpatient Hospital Stay (HOSPITAL_COMMUNITY)
Admission: AD | Admit: 2021-09-04 | Discharge: 2021-09-04 | Disposition: A | Discharge status: Home / Self Care | Payer: Medicaid Other | Attending: Obstetrics & Gynecology | Admitting: Obstetrics & Gynecology

## 2021-09-04 ENCOUNTER — Other Ambulatory Visit: Payer: Self-pay

## 2021-09-04 DIAGNOSIS — O3680X0 Pregnancy with inconclusive fetal viability, not applicable or unspecified: Secondary | ICD-10-CM | POA: Diagnosis present

## 2021-09-04 DIAGNOSIS — Z3A01 Less than 8 weeks gestation of pregnancy: Secondary | ICD-10-CM | POA: Diagnosis not present

## 2021-09-04 LAB — HCG, QUANTITATIVE, PREGNANCY: hCG, Beta Chain, Quant, S: 13359 m[IU]/mL — ABNORMAL HIGH (ref <5)

## 2021-09-04 NOTE — MAU Provider Note (Signed)
[name]  is a 21 y.o. G2P0010 at [redacted]w[redacted]d who presents to MAU today for follow-up quant hCG after 48 hours. The patient was seen in MAU on 09/02/21 and had quant hCG of 7209 and US showed IUGS but no YS or FP. She denies vaginal bleeding or fever today.  Reports minimal abdominal cramping. ? ?OB History  ?Gravida Para Term Preterm AB Living  ?2       1    ?SAB IAB Ectopic Multiple Live Births  ?1          ?  ?# Outcome Date GA Lbr Len/2nd Weight Sex Delivery Anes PTL Lv  ?2 Current           ?1 SAB 08/13/20 [redacted]w[redacted]d         ? ? ?Past Medical History:  ?Diagnosis Date  ? Bacterial vaginosis   ? Medical history non-contributory   ? ? ?ROS: ?no VB ?+ pain ? ?BP 128/77 (BP Location: Right Arm)   Pulse 96   Temp 98.7 ?F (37.1 ?C) (Oral)   Resp 18   Ht 5\' 3"  (1.6 m)   Wt 63 kg   LMP 07/10/2021   SpO2 100%   BMI 24.59 kg/m?   ?CONSTITUTIONAL: Well-developed, well-nourished female in no acute distress.  ?MUSCULOSKELETAL: Normal range of motion.  ?CARDIOVASCULAR: Regular heart rate ?RESPIRATORY: Normal effort ?NEUROLOGICAL: Alert and oriented to person, place, and time.  ?SKIN: Not diaphoretic. No erythema. No pallor. ?PSYCH: Normal mood and affect. Normal behavior. Normal judgment and thought content. ? ?Results for orders placed or performed during the hospital encounter of 09/04/21 (from the past 24 hour(s))  ?hCG, quantitative, pregnancy     Status: Abnormal  ? Collection Time: 09/04/21  4:39 PM  ?Result Value Ref Range  ? hCG, Beta Chain, Quant, S 13,359 (H) <5 mIU/mL  ? ? ?MDM: Labs ordered and reviewed. Good rise in [place]. Plan for repeat ultrasound in 2 weeks. Stable for discharge. ? ?A: ?1. Pregnancy, location unknown   ? ?P: ?Discharge home ?First trimester/ectopic precautions discussed ?Patient will return for [place] at [facility] in 2 weeks-ordered ?Patient may return to MAU as needed or if her condition were to change or worsen  ? ?[facility], CNM ?09/04/2021 6:22 PM  ? ?

## 2021-09-04 NOTE — MAU Note (Signed)
Natasha Kelly is a 21 y.o. at [redacted]w[redacted]d here in MAU reporting: for repeat HCG level.  Denies pain or VB. ? ?Onset of complaint: N/A ?Pain score: 0 ?Vitals:  ? 09/04/21 1630  ?BP: 128/77  ?Pulse: 96  ?Resp: 18  ?Temp: 98.7 ?F (37.1 ?C)  ?SpO2: 100%  ?   ?FHT:N/A ?Lab orders placed from triage:   HCG ? ?

## 2021-09-05 LAB — GC/CHLAMYDIA PROBE AMP (~~LOC~~) NOT AT ARMC
Chlamydia: NEGATIVE
Comment: NEGATIVE
Comment: NORMAL
Neisseria Gonorrhea: NEGATIVE

## 2021-09-14 ENCOUNTER — Ambulatory Visit (HOSPITAL_COMMUNITY)
Admission: RE | Admit: 2021-09-14 | Discharge: 2021-09-14 | Disposition: A | Discharge status: Home / Self Care | Payer: Medicaid Other | Source: Ambulatory Visit | Attending: Certified Nurse Midwife | Admitting: Certified Nurse Midwife

## 2021-09-14 ENCOUNTER — Ambulatory Visit (INDEPENDENT_AMBULATORY_CARE_PROVIDER_SITE_OTHER): Payer: Medicaid Other

## 2021-09-14 VITALS — BP 104/87 | HR 81 | Wt 135.8 lb

## 2021-09-14 DIAGNOSIS — N898 Other specified noninflammatory disorders of vagina: Secondary | ICD-10-CM

## 2021-09-14 DIAGNOSIS — O3680X0 Pregnancy with inconclusive fetal viability, not applicable or unspecified: Secondary | ICD-10-CM | POA: Diagnosis not present

## 2021-09-14 MED ORDER — MICONAZOLE NITRATE 100 MG VA SUPP: 100.0000 mg | Freq: Every day | VAGINAL | Status: AC

## 2021-09-14 NOTE — Progress Notes (Signed)
Patient here today following up from MFM for ultrasound results. I reviewed ultrasound results with Dr. Para March. Per Dr. Para March ultrasound results are normal and indicate a new EDD for patient. I reviewed this with the patient. Patient verbalized understanding.  ? ?Patient also states that on 09/02/21 she was prescribed Terazol cream for yeast infection treatment. Patient states she completed treatment and is still experiencing yeast infection symptoms of vaginal itching. I reviewed this with Dr. Para March. Per Dr. Para March patient can take Miconazole suppositories for a week to help treat the yeast infection. Prescription sent to patient's pharmacy. Patient denies any other questions.  ? ?Alesia Richards, RN ?09/14/21 ?

## 2021-10-05 ENCOUNTER — Telehealth (INDEPENDENT_AMBULATORY_CARE_PROVIDER_SITE_OTHER): Payer: Medicaid Other

## 2021-10-05 DIAGNOSIS — Z3A00 Weeks of gestation of pregnancy not specified: Secondary | ICD-10-CM

## 2021-10-05 DIAGNOSIS — Z3490 Encounter for supervision of normal pregnancy, unspecified, unspecified trimester: Secondary | ICD-10-CM | POA: Insufficient documentation

## 2021-10-05 DIAGNOSIS — Z3491 Encounter for supervision of normal pregnancy, unspecified, first trimester: Secondary | ICD-10-CM

## 2021-10-05 MED ORDER — GOJJI WEIGHT SCALE MISC: 1.0000 | 0 refills | Status: DC | PRN

## 2021-10-05 MED ORDER — BLOOD PRESSURE KIT DEVI: 1.0000 | 0 refills | Status: DC | PRN

## 2021-10-05 NOTE — Progress Notes (Signed)
New OB Intake  I connected with  Natasha Kelly on 10/05/21 at  3:15 PM EDT by MyChart Video Visit and verified that I am speaking with the correct person using two identifiers. Nurse is located at [facility] and pt is located at home.  I discussed the limitations, risks, security and privacy concerns of performing an evaluation and management service by telephone and the availability of in person appointments. I also discussed with the patient that there may be a patient responsible charge related to this service. The patient expressed understanding and agreed to proceed.  I explained I am completing New OB Intake today. We discussed her EDD of 12 that is based on LMP of 05/03/2022. Pt is G2/P0. I reviewed her allergies, medications, Medical/Surgical/OB history, and appropriate screenings. I informed her of [facility] services. Based on history, this is a/an  pregnancy uncomplicated .   Patient Active Problem List   Diagnosis Date Noted   Supervision of low-risk pregnancy 10/05/2021    Concerns addressed today  Delivery Plans:  Plans to deliver at [facility].   MyChart/Babyscripts MyChart access verified. I explained pt will have some visits in office and some virtually. Babyscripts instructions given and order placed. Patient verifies receipt of registration text/e-mail. Account successfully created and app downloaded.  Blood Pressure Cuff  Blood pressure cuff ordered for patient to pick-up from [employer]. Explained after first prenatal appt pt will check weekly and document in 70.  Weight scale: Patient does not  have weight scale. Weight scale ordered for patient to pick up from [employer].   Anatomy US Explained first scheduled [place] will be around 19 weeks. Anatomy US scheduled for 07/26/02023 at 2:30PM. Pt notified to arrive at 2:15pm. Scheduled AFP lab only appointment if CenteringPregnancy pt for same day as anatomy US.   Labs Discussed Johnsie Cancel genetic screening with  patient. Would like both Panorama and Horizon drawn at new OB visit.Also if interested in genetic testing, tell patient she will need AFP 15-21 weeks to complete genetic testing .Routine prenatal labs needed.  Covid Vaccine Patient has not covid vaccine.   Is patient a CenteringPregnancy candidate?  Declined Declined due to Support Person Concern  Is patient a Mom+Baby Combined Care candidate?  Declined      Is patient interested in [place]?  No    Informed patient of [facility] website  and placed link in her AVS.   Social Determinants of Health Food Insecurity: Patient denies food insecurity. WIC Referral: Patient is interested in referral to [facility].  Transportation: Patient denies transportation needs. Childcare: Discussed no children allowed at ultrasound appointments. Offered childcare services; patient declines childcare services at this time.  Send link to Pregnancy Navigators   Placed OB Box on problem list and updated  First visit review I reviewed new OB appt with pt. I explained she will have a pelvic exam, ob bloodwork with genetic screening. Explained pt will be seen by Clayton Lefort MD at first visit; encounter routed to appropriate provider. Explained that patient will be seen by pregnancy navigator following visit with provider. [facility] information placed in AVS.   [name], [place] 10/05/2021  3:31 PM

## 2021-10-27 IMAGING — US US OB COMP LESS 14 WK
1 series · 15 of 28 positions shown · non-contrast
Comparison: None.

CLINICAL DATA: 19-year-old female with vaginal bleeding for 4 days.
Estimated gestational age by LMP 8 weeks and 2 days.

EXAM:
OBSTETRIC <14 WK US AND TRANSVAGINAL OB US
TECHNIQUE: Both transabdominal and transvaginal ultrasound examinations were
performed for complete evaluation of the gestation as well as the
maternal uterus, adnexal regions, and pelvic cul-de-sac.
Transvaginal technique was performed to assess early pregnancy.

[Series 1: us ob comp less 14 wk · 57 acquisitions, 15 frames shown]
[im 1/57]
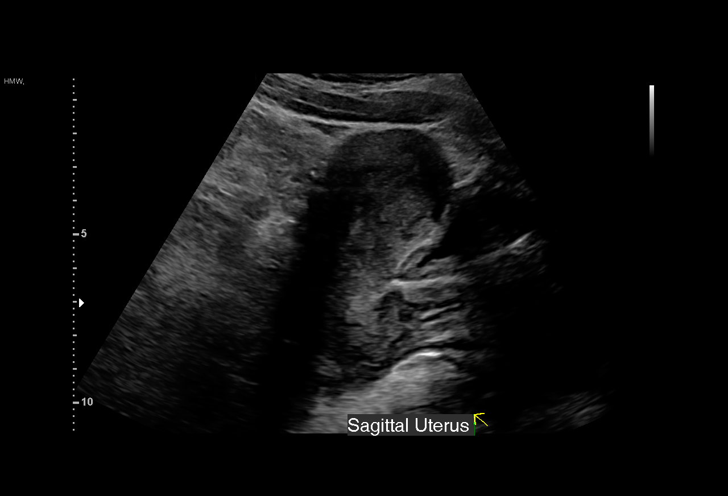
[im 5/57]
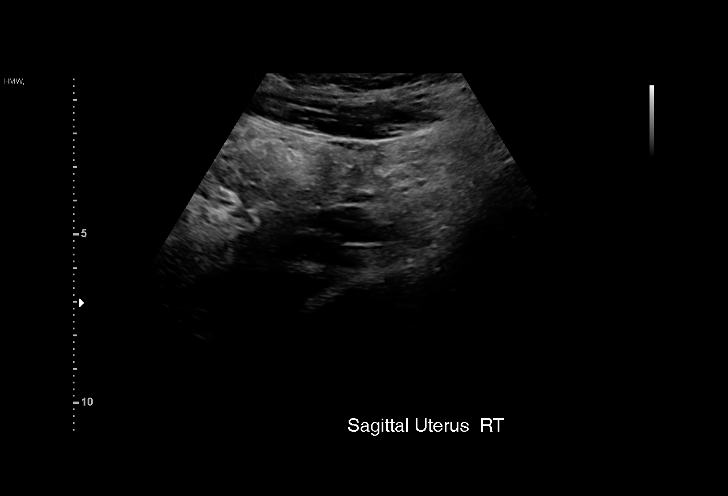
[im 9/57]
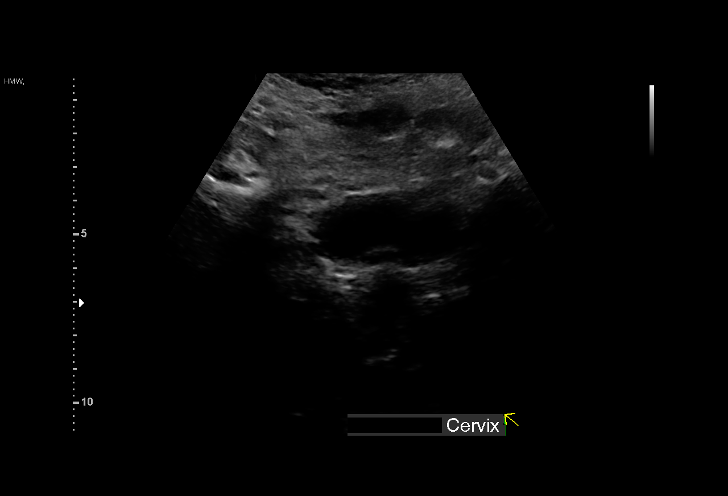
[im 13/57]
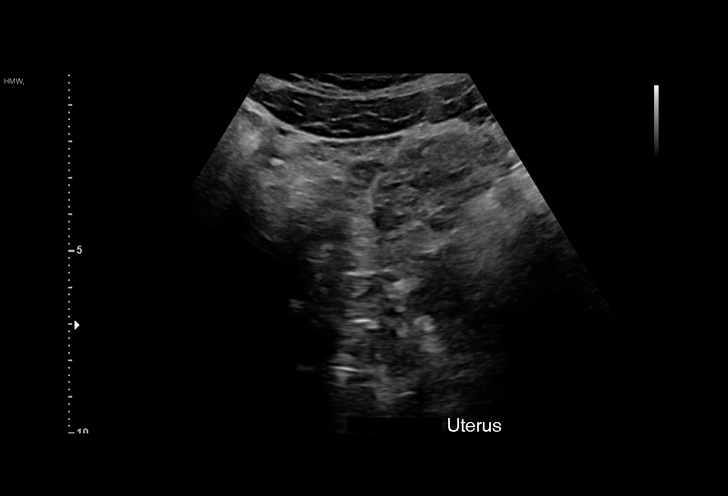
[im 17/57]
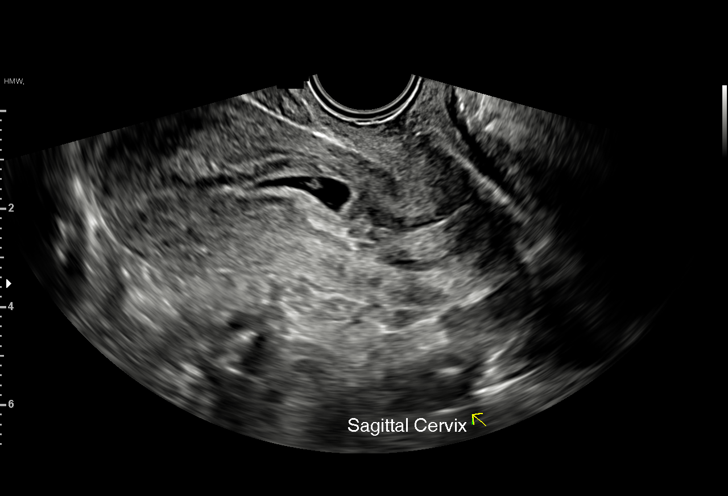
[im 21/57]
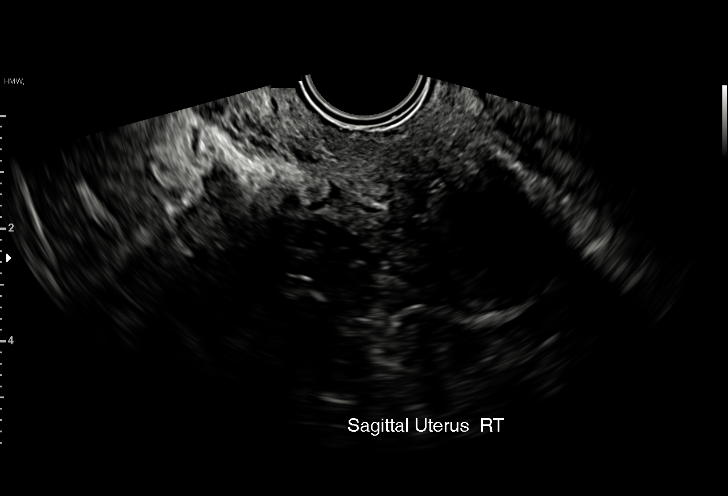
[im 25/57]
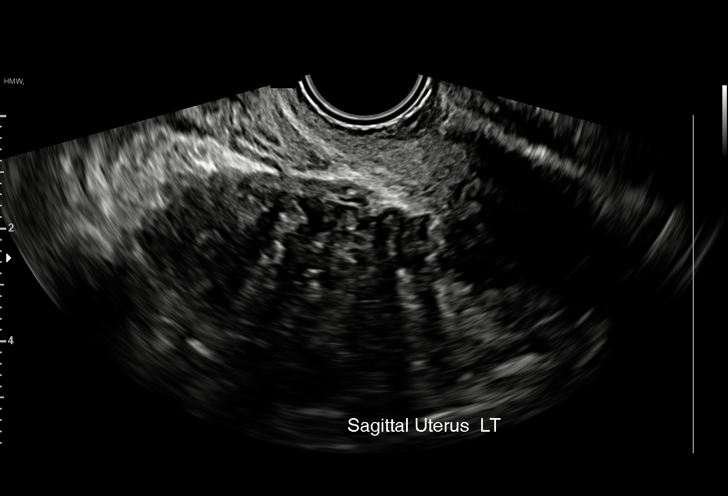
[im 30/57]
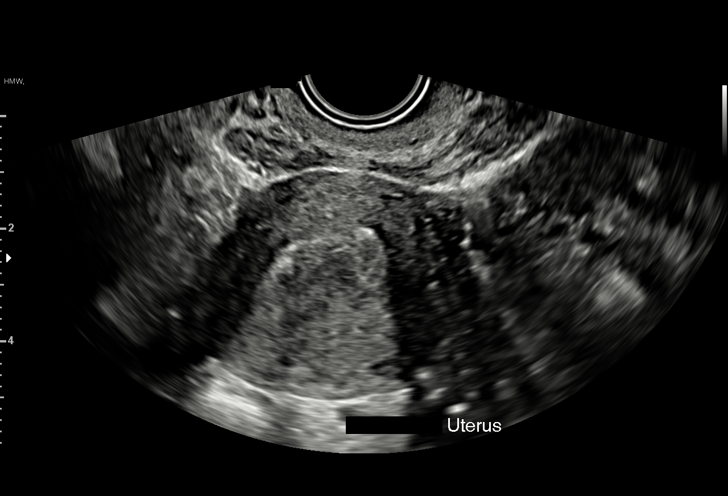
[im 32/57]
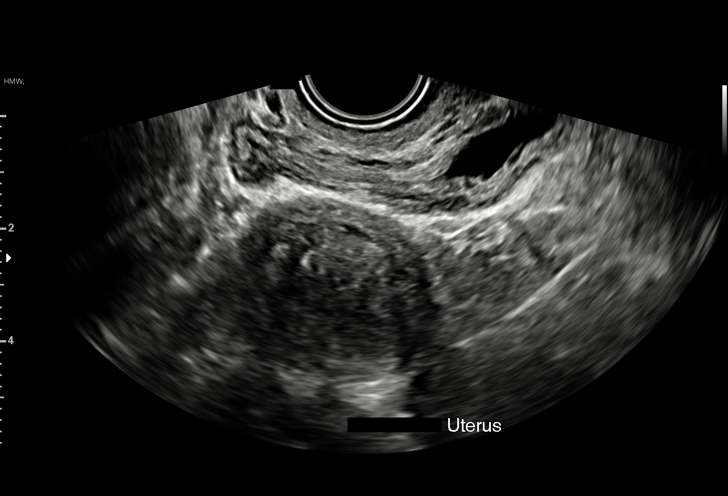
[im 36/57]
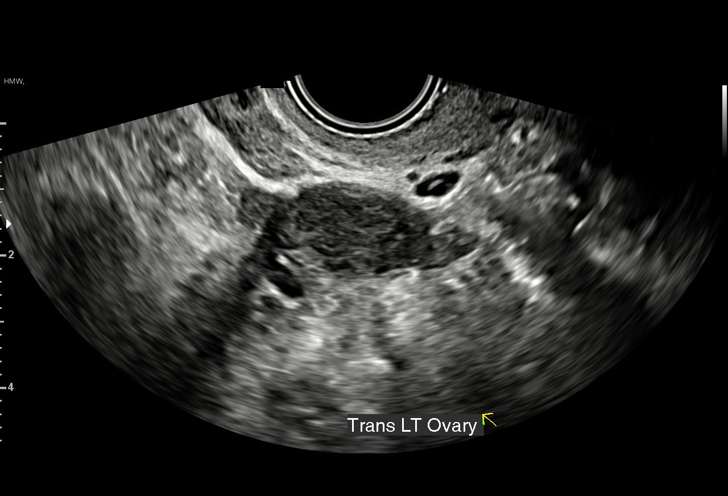
[im 40/57]
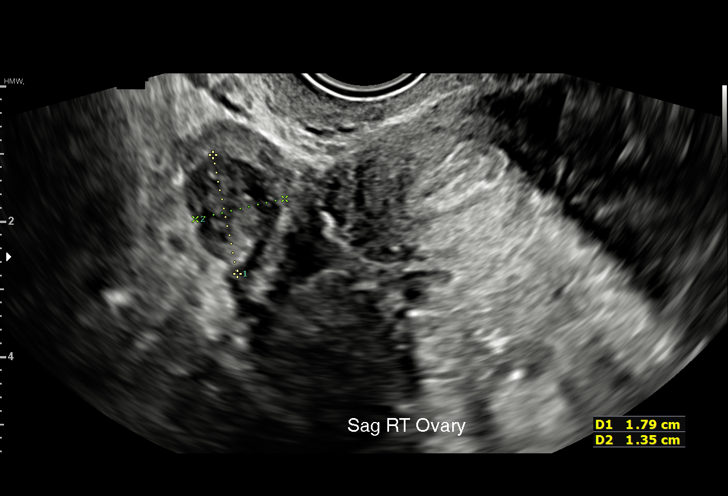
[im 44/57]
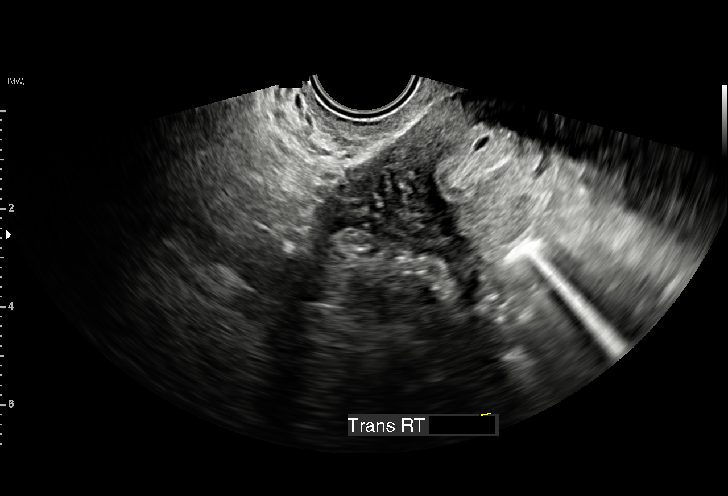
[im 48/57]
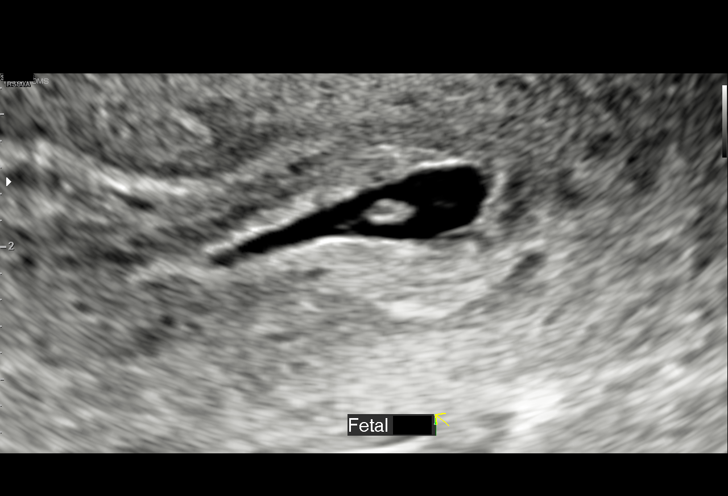
[im 52/57]
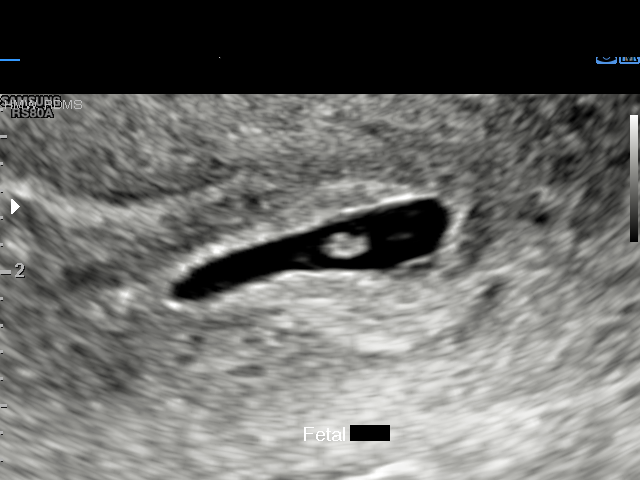
[im 57/57]
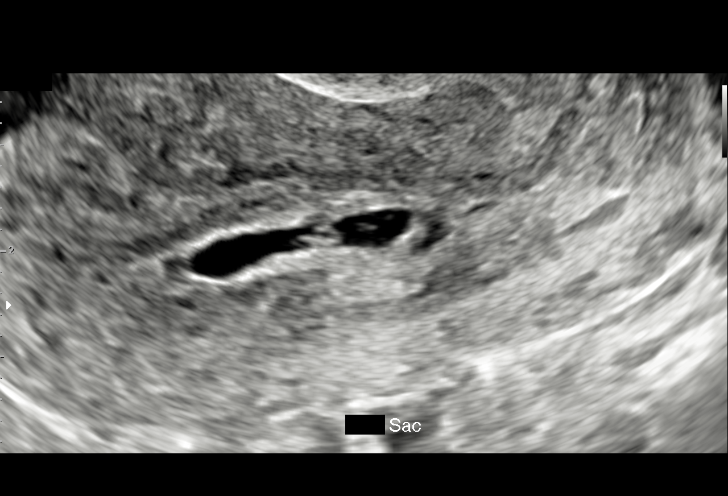

[15 of 28 positions shown; findings below may reference images not displayed]

FINDINGS: Intrauterine gestational sac: Single, irregularly-shaped and
somewhat located in the lower uterine segment (image 19).

Yolk sac:  Visible

Embryo:  Visible

Cardiac Activity: Not detected (image 53)

CRL:  3.9 mm   6 w   0 d

Subchorionic hemorrhage:  None visualized.

Maternal uterus/adnexae:

No pelvic free fluid. Both ovaries appear normal, the right is 1.5 x
1.8 x 1.4 cm and the left is 2.7 x 1.7 by 2.2 cm and probably
contains the corpus luteum (image 36).
IMPRESSION: Constellation suspicious for but not yet definitive of failed
pregnancy.

Recommend follow-up US in 10-14 days for definitive diagnosis. This
recommendation follows SRU consensus guidelines: Diagnostic Criteria
for Nonviable Pregnancy Early in the First Trimester. N Engl J Med

## 2021-10-28 IMAGING — US US OB TRANSVAGINAL
1 series · 15 of 28 positions shown · non-contrast
Comparison: 08/12/2020

CLINICAL DATA: Vaginal bleeding

EXAM:
TRANSVAGINAL OB ULTRASOUND
TECHNIQUE: Transvaginal ultrasound was performed for complete evaluation of the
gestation as well as the maternal uterus, adnexal regions, and
pelvic cul-de-sac.

[Series 1: us ob transvaginal · 15 of 35 slices shown]
[im 1/35]
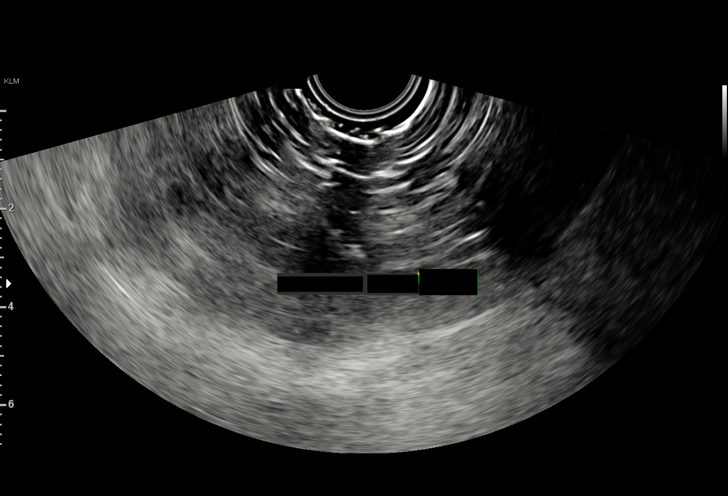
[im 3/35]
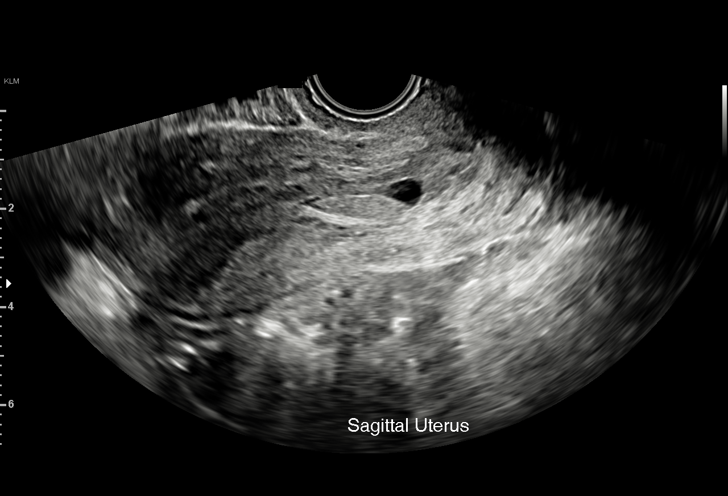
[im 6/35]
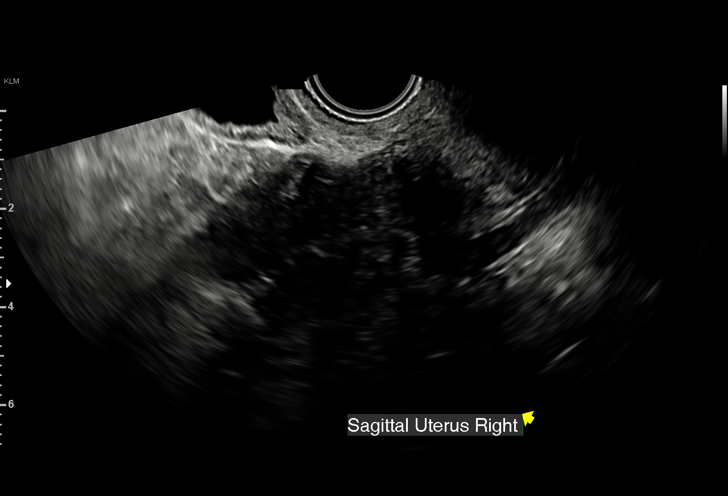
[im 8/35]
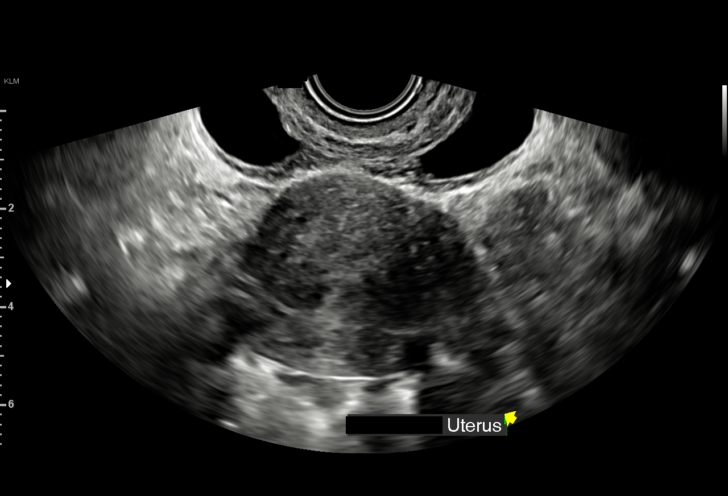
[im 11/35]
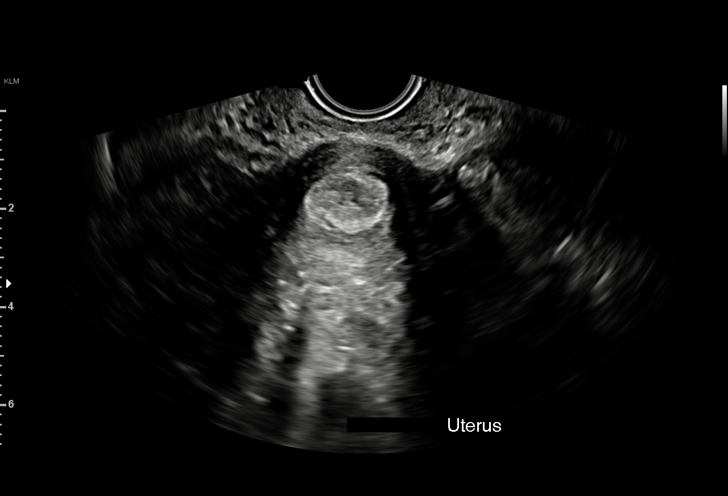
[im 13/35]
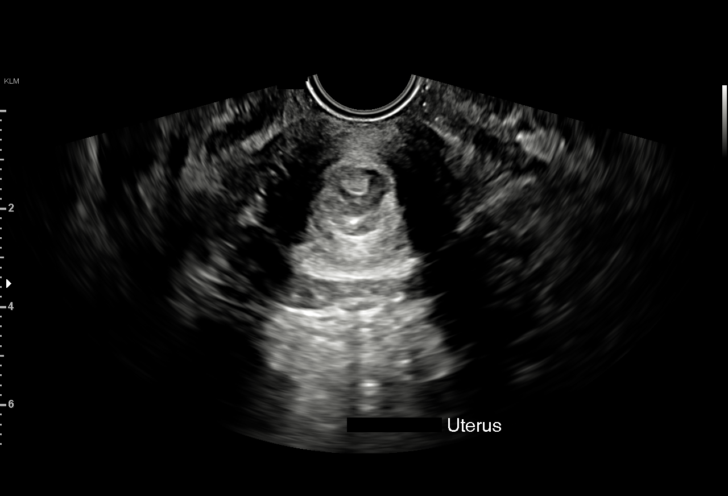
[im 16/35]
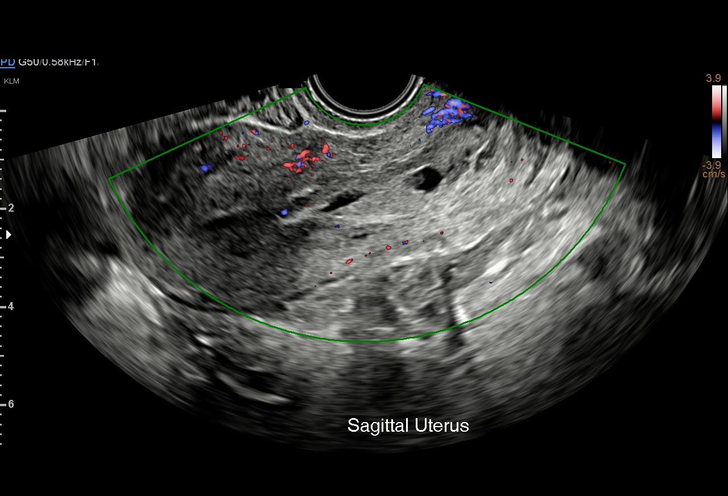
[im 18/35]
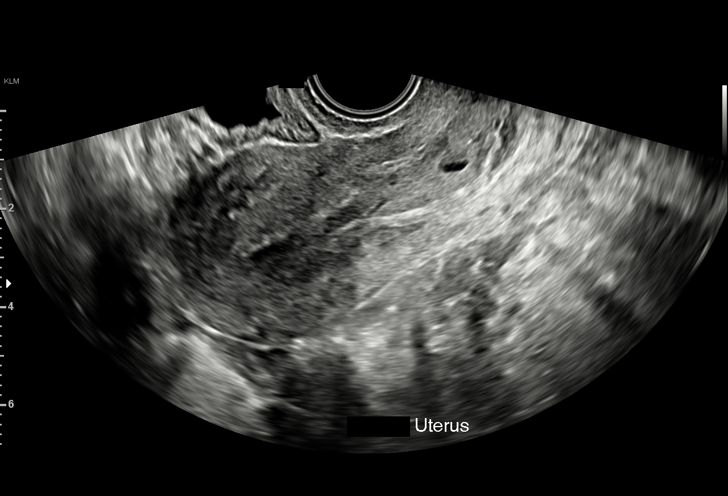
[im 19/35]
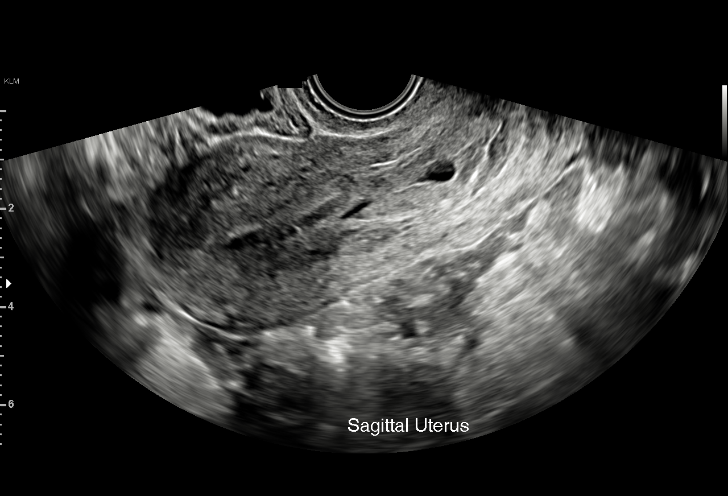
[im 22/35]
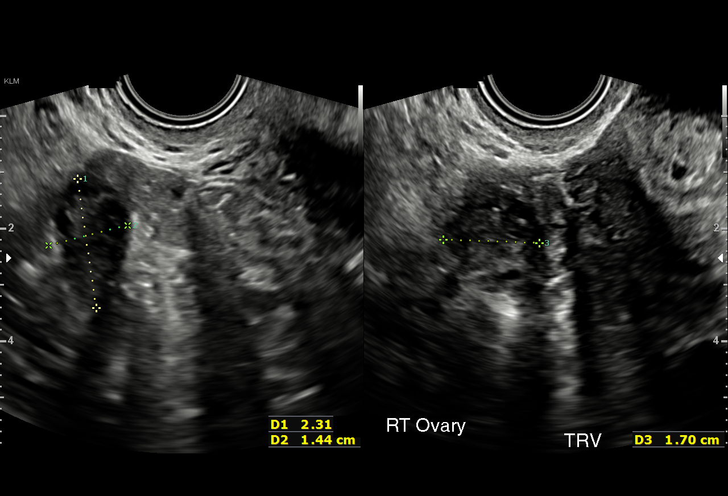
[im 24/35]
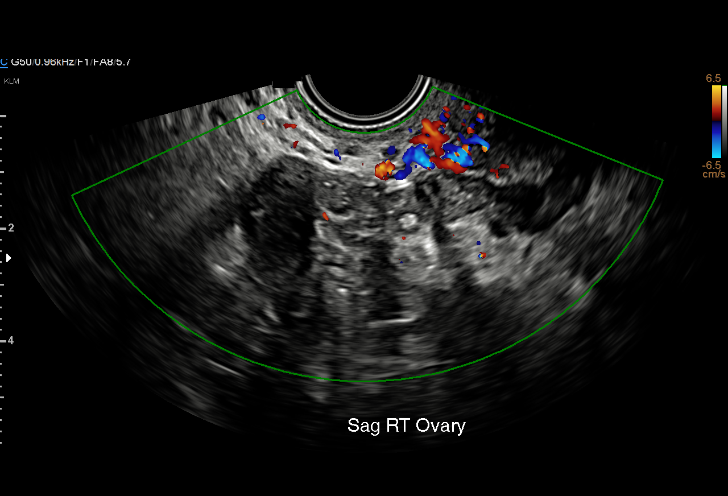
[im 27/35]
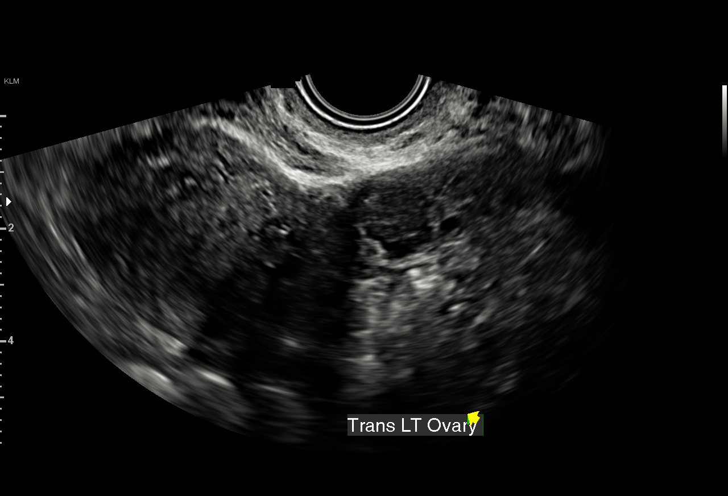
[im 29/35]
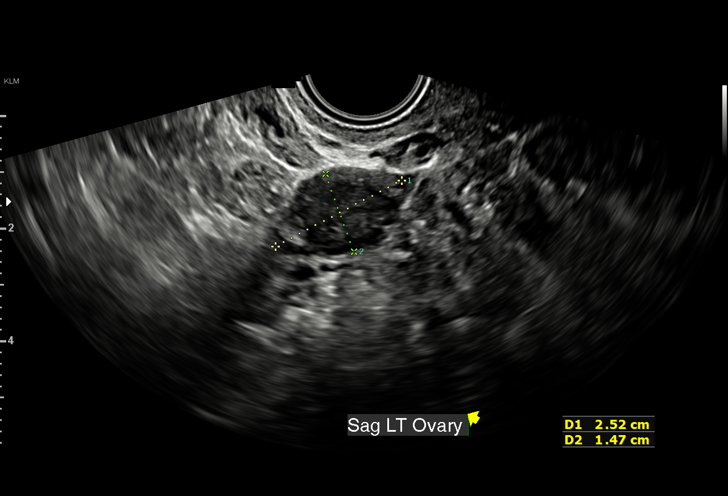
[im 32/35]
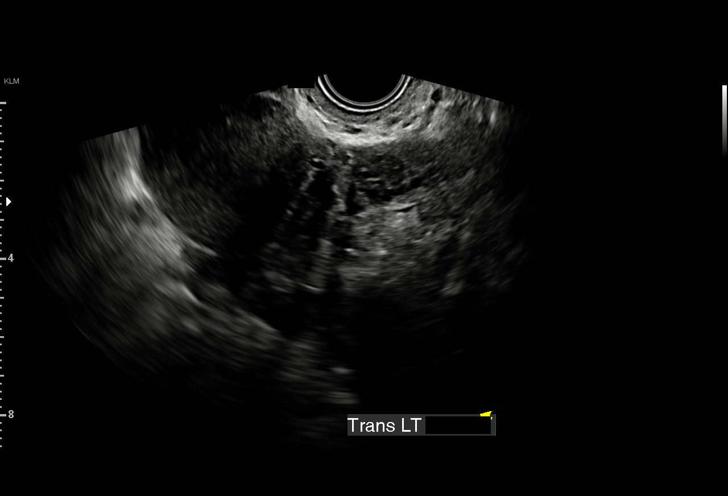
[im 35/35]
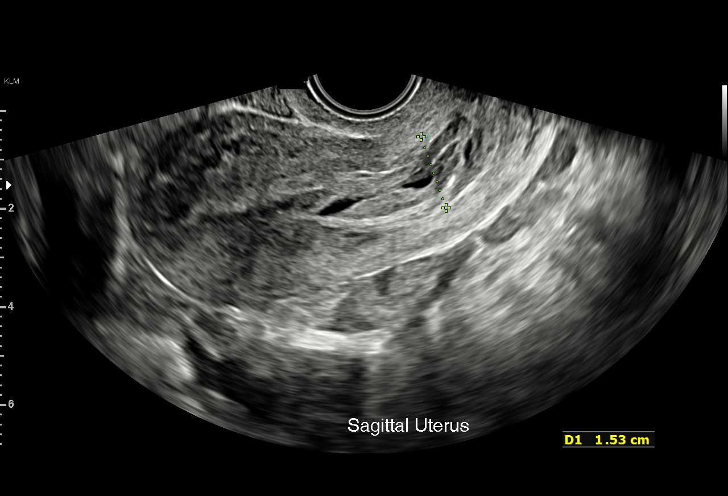

[15 of 28 positions shown; findings below may reference images not displayed]

FINDINGS: Intrauterine gestational sac: Not clearly identified

Yolk sac:  Not clearly identified

Embryo:  Nonvisualized

Maternal uterus/adnexae: Heterogenous endometrial collection within
the lower uterine segment and cervix measuring up to 1.6 cm. Ovaries
are within normal limits. Left ovary measures 2.5 x 1.5 by 2 cm.
Right ovary measures 1.7 x 2.3 x 1.4 cm. No significant free fluid.
IMPRESSION: The previously seen intrauterine gestational sac yolk sac and fetal
pole are not definitively visualized on today's examination.
Heterogenous endometrial thickening/complex fluid collection in the
lower uterine segment and cervix which may reflect blood product

## 2021-11-08 ENCOUNTER — Encounter: Payer: Self-pay | Admitting: Family Medicine

## 2021-11-08 ENCOUNTER — Ambulatory Visit (INDEPENDENT_AMBULATORY_CARE_PROVIDER_SITE_OTHER): Payer: Medicaid Other | Admitting: Family Medicine

## 2021-11-08 ENCOUNTER — Other Ambulatory Visit: Payer: Self-pay

## 2021-11-08 ENCOUNTER — Other Ambulatory Visit (HOSPITAL_COMMUNITY)
Admission: RE | Admit: 2021-11-08 | Discharge: 2021-11-08 | Disposition: A | Discharge status: Home / Self Care | Payer: Medicaid Other | Source: Ambulatory Visit | Attending: Family Medicine | Admitting: Family Medicine

## 2021-11-08 VITALS — BP 124/79 | HR 94 | Wt 145.7 lb

## 2021-11-08 DIAGNOSIS — Z3A14 14 weeks gestation of pregnancy: Secondary | ICD-10-CM | POA: Diagnosis not present

## 2021-11-08 DIAGNOSIS — N898 Other specified noninflammatory disorders of vagina: Secondary | ICD-10-CM | POA: Insufficient documentation

## 2021-11-08 DIAGNOSIS — W3400XA Accidental discharge from unspecified firearms or gun, initial encounter: Secondary | ICD-10-CM | POA: Diagnosis not present

## 2021-11-08 DIAGNOSIS — Z3491 Encounter for supervision of normal pregnancy, unspecified, first trimester: Secondary | ICD-10-CM | POA: Diagnosis not present

## 2021-11-08 DIAGNOSIS — O99012 Anemia complicating pregnancy, second trimester: Secondary | ICD-10-CM | POA: Insufficient documentation

## 2021-11-08 DIAGNOSIS — O219 Vomiting of pregnancy, unspecified: Secondary | ICD-10-CM

## 2021-11-08 MED ORDER — FERROUS SULFATE 325 (65 FE) MG PO TABS: 325.0000 mg | ORAL_TABLET | ORAL | 0 refills | Status: DC

## 2021-11-08 MED ORDER — ONDANSETRON 4 MG PO TBDP: 4.0000 mg | ORAL_TABLET | Freq: Three times a day (TID) | ORAL | 2 refills | Status: DC | PRN

## 2021-11-08 MED ORDER — PROMETHAZINE HCL 25 MG PO TABS: 25.0000 mg | ORAL_TABLET | Freq: Four times a day (QID) | ORAL | 2 refills | Status: DC | PRN

## 2021-11-08 MED ORDER — FAMOTIDINE 20 MG PO TABS: 20.0000 mg | ORAL_TABLET | Freq: Two times a day (BID) | ORAL | 1 refills | Status: DC

## 2021-11-09 ENCOUNTER — Encounter: Payer: Self-pay | Admitting: *Deleted

## 2021-11-09 LAB — CBC/D/PLT+RPR+RH+ABO+RUBIGG...
Antibody Screen: NEGATIVE
Basophils Absolute: 0 10*3/uL (ref 0.0–0.2)
Basos: 0 %
EOS (ABSOLUTE): 0.1 10*3/uL (ref 0.0–0.4)
Eos: 2 %
HCV Ab: NONREACTIVE
HIV Screen 4th Generation wRfx: NONREACTIVE
Hematocrit: 29.6 % — ABNORMAL LOW (ref 34.0–46.6)
Hemoglobin: 9.2 g/dL — ABNORMAL LOW (ref 11.1–15.9)
Hepatitis B Surface Ag: NEGATIVE
Immature Grans (Abs): 0 10*3/uL (ref 0.0–0.1)
Immature Granulocytes: 0 %
Lymphocytes Absolute: 1.8 10*3/uL (ref 0.7–3.1)
Lymphs: 21 %
MCH: 21.3 pg — ABNORMAL LOW (ref 26.6–33.0)
MCHC: 31.1 g/dL — ABNORMAL LOW (ref 31.5–35.7)
MCV: 69 fL — ABNORMAL LOW (ref 79–97)
Monocytes Absolute: 0.7 10*3/uL (ref 0.1–0.9)
Monocytes: 8 %
Neutrophils Absolute: 6 10*3/uL (ref 1.4–7.0)
Neutrophils: 69 %
Platelets: 376 10*3/uL (ref 150–450)
RBC: 4.32 x10E6/uL (ref 3.77–5.28)
RDW: 19.7 % — ABNORMAL HIGH (ref 11.7–15.4)
RPR Ser Ql: NONREACTIVE
Rh Factor: POSITIVE
Rubella Antibodies, IGG: 4.24 index (ref >0.99)
WBC: 8.6 10*3/uL (ref 3.4–10.8)

## 2021-11-09 LAB — CERVICOVAGINAL ANCILLARY ONLY
Bacterial Vaginitis (gardnerella): POSITIVE — AB
Candida Glabrata: NEGATIVE
Candida Vaginitis: POSITIVE — AB
Comment: NEGATIVE
Comment: NEGATIVE
Comment: NEGATIVE

## 2021-11-09 LAB — HEMOGLOBIN A1C
Est. average glucose Bld gHb Est-mCnc: 103 mg/dL
Hgb A1c MFr Bld: 5.2 % (ref 4.8–5.6)

## 2021-11-09 LAB — HCV INTERPRETATION

## 2021-11-09 MED ORDER — METRONIDAZOLE 0.75 % VA GEL: 1.0000 | Freq: Every day | VAGINAL | 0 refills | Status: AC

## 2021-11-09 MED ORDER — FLUCONAZOLE 150 MG PO TABS: 150.0000 mg | ORAL_TABLET | Freq: Once | ORAL | 0 refills | Status: AC

## 2021-11-09 NOTE — Addendum Note (Signed)
Addended by: Merian Capron on: 11/09/2021 01:22 PM   Modules accepted: Orders

## 2021-11-14 ENCOUNTER — Encounter: Payer: Self-pay | Admitting: Family Medicine

## 2021-11-14 DIAGNOSIS — R8271 Bacteriuria: Secondary | ICD-10-CM | POA: Insufficient documentation

## 2021-11-14 LAB — URINE CULTURE, OB REFLEX

## 2021-11-14 LAB — CULTURE, OB URINE

## 2021-11-15 LAB — HORIZON CUSTOM: REPORT SUMMARY: NEGATIVE

## 2021-11-21 NOTE — Progress Notes (Signed)
Encounter opened in error

## 2021-12-07 ENCOUNTER — Encounter: Payer: Self-pay | Admitting: Family Medicine

## 2021-12-07 ENCOUNTER — Ambulatory Visit: Payer: Medicaid Other | Attending: Family Medicine

## 2021-12-07 ENCOUNTER — Encounter: Payer: Medicaid Other | Admitting: Family Medicine

## 2021-12-07 ENCOUNTER — Other Ambulatory Visit: Payer: Self-pay

## 2021-12-07 ENCOUNTER — Ambulatory Visit (INDEPENDENT_AMBULATORY_CARE_PROVIDER_SITE_OTHER): Payer: Medicaid Other | Admitting: Family Medicine

## 2021-12-07 VITALS — BP 119/78 | HR 99 | Wt 155.8 lb

## 2021-12-07 DIAGNOSIS — Z3A19 19 weeks gestation of pregnancy: Secondary | ICD-10-CM | POA: Diagnosis not present

## 2021-12-07 DIAGNOSIS — Z363 Encounter for antenatal screening for malformations: Secondary | ICD-10-CM | POA: Diagnosis not present

## 2021-12-07 DIAGNOSIS — O358XX0 Maternal care for other (suspected) fetal abnormality and damage, not applicable or unspecified: Secondary | ICD-10-CM | POA: Diagnosis present

## 2021-12-07 DIAGNOSIS — Z3491 Encounter for supervision of normal pregnancy, unspecified, first trimester: Secondary | ICD-10-CM | POA: Insufficient documentation

## 2021-12-07 DIAGNOSIS — O99012 Anemia complicating pregnancy, second trimester: Secondary | ICD-10-CM

## 2021-12-07 DIAGNOSIS — O43122 Velamentous insertion of umbilical cord, second trimester: Secondary | ICD-10-CM | POA: Diagnosis not present

## 2021-12-07 DIAGNOSIS — O43199 Other malformation of placenta, unspecified trimester: Secondary | ICD-10-CM | POA: Insufficient documentation

## 2021-12-07 DIAGNOSIS — Z3492 Encounter for supervision of normal pregnancy, unspecified, second trimester: Secondary | ICD-10-CM

## 2021-12-07 DIAGNOSIS — R8271 Bacteriuria: Secondary | ICD-10-CM

## 2021-12-07 NOTE — Patient Instructions (Signed)
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
## 2021-12-08 ENCOUNTER — Other Ambulatory Visit: Payer: Self-pay | Admitting: *Deleted

## 2021-12-08 DIAGNOSIS — O43199 Other malformation of placenta, unspecified trimester: Secondary | ICD-10-CM

## 2021-12-08 DIAGNOSIS — Z362 Encounter for other antenatal screening follow-up: Secondary | ICD-10-CM

## 2021-12-09 LAB — AFP, SERUM, OPEN SPINA BIFIDA
AFP MoM: 1.97
AFP Value: 108.3 ng/mL
Gest. Age on Collection Date: 19 weeks
Maternal Age At EDD: 20.4 yr
OSBR Risk 1 IN: 1723
Test Results:: NEGATIVE
Weight: 155 [lb_av]

## 2022-01-05 ENCOUNTER — Ambulatory Visit (INDEPENDENT_AMBULATORY_CARE_PROVIDER_SITE_OTHER): Payer: Medicaid Other | Admitting: Family Medicine

## 2022-01-05 ENCOUNTER — Other Ambulatory Visit: Payer: Self-pay

## 2022-01-05 ENCOUNTER — Encounter: Payer: Self-pay | Admitting: Family Medicine

## 2022-01-05 VITALS — BP 134/88 | HR 114 | Wt 170.9 lb

## 2022-01-05 DIAGNOSIS — Z3492 Encounter for supervision of normal pregnancy, unspecified, second trimester: Secondary | ICD-10-CM

## 2022-01-05 DIAGNOSIS — O99012 Anemia complicating pregnancy, second trimester: Secondary | ICD-10-CM

## 2022-01-05 DIAGNOSIS — O43199 Other malformation of placenta, unspecified trimester: Secondary | ICD-10-CM

## 2022-01-05 DIAGNOSIS — R8271 Bacteriuria: Secondary | ICD-10-CM
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
## 2022-01-06 ENCOUNTER — Ambulatory Visit: Payer: Medicaid Other | Admitting: *Deleted

## 2022-01-06 ENCOUNTER — Ambulatory Visit: Payer: Medicaid Other | Attending: Obstetrics

## 2022-01-06 ENCOUNTER — Encounter: Payer: Self-pay | Admitting: Family Medicine

## 2022-01-06 VITALS — BP 135/84 | HR 118

## 2022-01-06 DIAGNOSIS — O43199 Other malformation of placenta, unspecified trimester: Secondary | ICD-10-CM | POA: Diagnosis not present

## 2022-01-06 DIAGNOSIS — O321XX0 Maternal care for breech presentation, not applicable or unspecified: Secondary | ICD-10-CM | POA: Insufficient documentation

## 2022-01-06 DIAGNOSIS — Z3492 Encounter for supervision of normal pregnancy, unspecified, second trimester: Secondary | ICD-10-CM

## 2022-01-06 DIAGNOSIS — Z3A23 23 weeks gestation of pregnancy: Secondary | ICD-10-CM | POA: Diagnosis not present

## 2022-01-06 DIAGNOSIS — O43122 Velamentous insertion of umbilical cord, second trimester: Secondary | ICD-10-CM | POA: Diagnosis not present

## 2022-01-06 DIAGNOSIS — O358XX0 Maternal care for other (suspected) fetal abnormality and damage, not applicable or unspecified: Secondary | ICD-10-CM | POA: Diagnosis not present

## 2022-01-06 DIAGNOSIS — Z362 Encounter for other antenatal screening follow-up: Secondary | ICD-10-CM | POA: Diagnosis not present

## 2022-01-06 LAB — CBC
Hematocrit: 29.1 % — ABNORMAL LOW (ref 34.0–46.6)
Hemoglobin: 9.2 g/dL — ABNORMAL LOW (ref 11.1–15.9)
MCH: 23.5 pg — ABNORMAL LOW (ref 26.6–33.0)
MCHC: 31.6 g/dL (ref 31.5–35.7)
MCV: 74 fL — ABNORMAL LOW (ref 79–97)
Platelets: 326 10*3/uL (ref 150–450)
RBC: 3.91 x10E6/uL (ref 3.77–5.28)
RDW: 21.2 % — ABNORMAL HIGH (ref 11.7–15.4)
WBC: 11.5 10*3/uL — ABNORMAL HIGH (ref 3.4–10.8)

## 2022-01-09 ENCOUNTER — Other Ambulatory Visit: Payer: Self-pay | Admitting: *Deleted

## 2022-01-09 DIAGNOSIS — O43199 Other malformation of placenta, unspecified trimester: Secondary | ICD-10-CM

## 2022-01-10 ENCOUNTER — Other Ambulatory Visit: Payer: Self-pay | Admitting: Family Medicine

## 2022-01-10 DIAGNOSIS — O99012 Anemia complicating pregnancy, second trimester: Secondary | ICD-10-CM

## 2022-01-13 ENCOUNTER — Encounter (HOSPITAL_COMMUNITY): Payer: Self-pay | Admitting: Obstetrics & Gynecology

## 2022-01-13 ENCOUNTER — Inpatient Hospital Stay (HOSPITAL_COMMUNITY)
Admission: AD | Admit: 2022-01-13 | Discharge: 2022-01-13 | Disposition: A | Discharge status: Home / Self Care | Payer: Medicaid Other | Attending: Obstetrics & Gynecology | Admitting: Obstetrics & Gynecology

## 2022-01-13 DIAGNOSIS — Z3A24 24 weeks gestation of pregnancy: Secondary | ICD-10-CM | POA: Insufficient documentation

## 2022-01-13 DIAGNOSIS — B3731 Acute candidiasis of vulva and vagina: Secondary | ICD-10-CM | POA: Diagnosis not present

## 2022-01-13 DIAGNOSIS — O98812 Other maternal infectious and parasitic diseases complicating pregnancy, second trimester: Secondary | ICD-10-CM | POA: Diagnosis not present

## 2022-01-13 DIAGNOSIS — Z3492 Encounter for supervision of normal pregnancy, unspecified, second trimester: Secondary | ICD-10-CM

## 2022-01-13 DIAGNOSIS — O42912 Preterm premature rupture of membranes, unspecified as to length of time between rupture and onset of labor, second trimester: Secondary | ICD-10-CM | POA: Diagnosis present

## 2022-01-13 DIAGNOSIS — O23592 Infection of other part of genital tract in pregnancy, second trimester: Secondary | ICD-10-CM | POA: Insufficient documentation

## 2022-01-13 DIAGNOSIS — O4692 Antepartum hemorrhage, unspecified, second trimester: Secondary | ICD-10-CM | POA: Diagnosis not present

## 2022-01-13 HISTORY — DX: Chlamydial infection, unspecified: A74.9

## 2022-01-13 HISTORY — DX: Gonococcal infection, unspecified: A54.9

## 2022-01-13 HISTORY — DX: Anemia, unspecified: D64.9

## 2022-01-13 HISTORY — DX: Trichomoniasis, unspecified: A59.9

## 2022-01-13 HISTORY — DX: Urinary tract infection, site not specified: N39.0

## 2022-01-13 LAB — WET PREP, GENITAL
Sperm: NONE SEEN
Trich, Wet Prep: NONE SEEN
WBC, Wet Prep HPF POC: >=10 — AB (ref <10)
Yeast Wet Prep HPF POC: NONE SEEN

## 2022-01-13 LAB — URINALYSIS, ROUTINE W REFLEX MICROSCOPIC
Bilirubin Urine: NEGATIVE
Glucose, UA: NEGATIVE mg/dL
Ketones, ur: NEGATIVE mg/dL
Nitrite: NEGATIVE
Protein, ur: NEGATIVE mg/dL
RBC / HPF: >50 RBC/hpf — ABNORMAL HIGH (ref 0–5)
Specific Gravity, Urine: 1.005 (ref 1.005–1.030)
WBC, UA: >50 WBC/hpf — ABNORMAL HIGH (ref 0–5)
pH: 7 (ref 5.0–8.0)

## 2022-01-13 MED ORDER — TERCONAZOLE 0.4 % VA CREA
1.0000 | TOPICAL_CREAM | Freq: Every day | VAGINAL | 0 refills | Status: AC
Start: 2022-01-13 — End: 2022-01-20

## 2022-01-13 NOTE — MAU Note (Signed)
Natasha Kelly is a 21 y.o. at [redacted]w[redacted]d here in MAU reporting: noted small amt of red last night when wiped. Only noted one time.  This morning, had watery d/c, ran down her leg.  Denies any pain. Had an exam last wk, was closed.  No pad on, panties were wet when she got here. Denies recent intercourse  Onset of complaint: last night Pain score: 0 Vitals:   01/13/22 0755  BP: 129/81  Pulse: (!) 123  Resp: 18  Temp: 98 F (36.7 C)  SpO2: 99%     FHT:150 Lab orders placed from triage:  urine

## 2022-01-13 NOTE — MAU Provider Note (Signed)
History     CSN: [id number]  Arrival date and time: 01/13/22 3790   Event Date/Time   First Provider Initiated Contact with Patient 01/13/22 0809      Chief Complaint  Patient presents with   Rupture of Membranes   Vaginal Bleeding   Natasha Kelly is a 21 y.o. G2P0010 at [redacted]w[redacted]d who presents today with vaginal bleeding. She states that last night she had one episode of spotting. That seems to have resolved, but now she is having a more watery discharge. She states that last week she had yellow discharge and was treated for yeast. However, she does not that think that resolved. She denies any pain or contractions. She is feeling fetal movement.   Vaginal Bleeding The patient's primary symptoms include vaginal bleeding and vaginal discharge. This is a new problem. The current episode started yesterday. The problem has been resolved. The patient is experiencing no pain. She is pregnant. The vaginal discharge was watery and white. The vaginal bleeding is spotting. Nothing aggravates the symptoms. She has tried antifungals for the symptoms. The treatment provided no relief.    OB History     Gravida  2   Para  0   Term  0   Preterm  0   AB  1   Living  0      SAB  1   IAB  0   Ectopic  0   Multiple  0   Live Births  0           Past Medical History:  Diagnosis Date   Anemia    Bacterial vaginosis    Chlamydia    Gonorrhea    Trichomonas infection    UTI (urinary tract infection)     Past Surgical History:  Procedure Laterality Date   NO PAST SURGERIES      Family History  Problem Relation Age of Onset   Healthy Mother    Healthy Father    Asthma Sister    Cancer Maternal Grandmother    Diabetes Neg Hx    Heart disease Neg Hx    Hypertension Neg Hx    Stroke Neg Hx     Social History   Tobacco Use   Smoking status: Never   Smokeless tobacco: Never  Vaping Use   Vaping Use: Never used  Substance Use Topics   Alcohol use: Never   Drug  use: Never    Allergies: No Known Allergies  Medications Prior to Admission  Medication Sig Dispense Refill Last Dose   ferrous sulfate 325 (65 FE) MG tablet Take 1 tablet (325 mg total) by mouth every other day. 30 tablet 0 01/12/2022   Prenatal Vit-Fe Fumarate-FA (MULTIVITAMIN-PRENATAL) 27-0.8 MG TABS tablet Take 1 tablet by mouth daily at 12 noon.   01/12/2022   vitamin C (ASCORBIC ACID) 250 MG tablet Take 1 tablet (250 mg total) by mouth daily. 30 tablet 0 01/12/2022    Review of Systems  Genitourinary:  Positive for vaginal bleeding and vaginal discharge.  All other systems reviewed and are negative.  Physical Exam   Blood pressure 129/81, pulse (!) 123, temperature 98 F (36.7 C), temperature source Oral, resp. rate 18, height 5\' 3"  (1.6 m), weight 80.7 kg, last menstrual period 07/10/2021, SpO2 99 %, unknown if currently breastfeeding.  Physical Exam Constitutional:      Appearance: She is well-developed.  HENT:     Head: Normocephalic.  Eyes:     Pupils: Pupils are equal,  round, and reactive to light.  Cardiovascular:     Rate and Rhythm: Normal rate and regular rhythm.     Heart sounds: Normal heart sounds.  Pulmonary:     Effort: Pulmonary effort is normal. No respiratory distress.     Breath sounds: Normal breath sounds.  Abdominal:     Palpations: Abdomen is soft.     Tenderness: There is no abdominal tenderness.  Genitourinary:    Vagina: No bleeding. Vaginal discharge: mucusy.    Comments: External: no lesion Vagina: large amount of thick, adherent discharge c/w yeast infection No bleeding seen, no pooling of fluid seen      Musculoskeletal:        General: Normal range of motion.     Cervical back: Normal range of motion and neck supple.  Skin:    General: Skin is warm and dry.  Neurological:     Mental Status: She is alert and oriented to person, place, and time.  Psychiatric:        Mood and Affect: Mood normal.        Behavior: Behavior normal.      NST:  Baseline: 150 Variability: moderate Accels: 10x10 Decels: none Toco: none Reactive/Appropriate for GA  Results for orders placed or performed during the hospital encounter of 01/13/22 (from the past 24 hour(s))  Wet prep, genital     Status: Abnormal   Collection Time: 01/13/22  8:17 AM   Specimen: Vaginal  Result Value Ref Range   Yeast Wet Prep HPF POC NONE SEEN NONE SEEN   Trich, Wet Prep NONE SEEN NONE SEEN   Clue Cells Wet Prep HPF POC PRESENT (A) NONE SEEN   WBC, Wet Prep HPF POC >=10 (A) <10   Sperm NONE SEEN     MAU Course  Procedures  MDM   Assessment and Plan   1. Encounter for supervision of low-risk pregnancy in second trimester   2. Yeast infection involving the vagina and surrounding area   3. [redacted] weeks gestation of pregnancy    DC home in stable condition  Comfort measures reviewed  2nd/3rd Trimester precautions  PTL precautions  Fetal kick counts RX: Terazol 7 as directed  Return to MAU as needed FU with OB as planned   Follow-up Information     [facility] for [facility] Follow up.   Specialty: Obstetrics and Gynecology Why: As scheduled Contact information: [address] [place] [address] [phone]               Thressa Sheller DNP, CNM  01/13/22  8:41 AM

## 2022-01-17 LAB — GC/CHLAMYDIA PROBE AMP (~~LOC~~) NOT AT ARMC
Chlamydia: NEGATIVE
Comment: NEGATIVE
Comment: NORMAL
Neisseria Gonorrhea: NEGATIVE

## 2022-01-23 ENCOUNTER — Ambulatory Visit (HOSPITAL_COMMUNITY)
Admission: RE | Admit: 2022-01-23 | Discharge: 2022-01-23 | Disposition: A | Discharge status: Home / Self Care | Payer: Medicaid Other | Source: Ambulatory Visit | Attending: Family Medicine | Admitting: Family Medicine

## 2022-01-23 DIAGNOSIS — O99012 Anemia complicating pregnancy, second trimester: Secondary | ICD-10-CM | POA: Diagnosis present

## 2022-01-23 MED ORDER — SODIUM CHLORIDE 0.9 % IV SOLN
300.0000 mg | INTRAVENOUS | Status: DC
  Administered 2022-01-23: 300 mg via INTRAVENOUS
  Filled 2022-01-23: qty 300

## 2022-01-30 ENCOUNTER — Encounter (HOSPITAL_COMMUNITY)
Admission: RE | Admit: 2022-01-30 | Discharge: 2022-01-30 | Disposition: A | Discharge status: Home / Self Care | Payer: Medicaid Other | Source: Ambulatory Visit | Attending: Family Medicine | Admitting: Family Medicine

## 2022-01-30 DIAGNOSIS — O99012 Anemia complicating pregnancy, second trimester: Secondary | ICD-10-CM | POA: Diagnosis present

## 2022-01-30 MED ORDER — SODIUM CHLORIDE 0.9 % IV BOLUS: 500.0000 mL | Freq: Once | INTRAVENOUS | Status: DC | PRN

## 2022-01-30 MED ORDER — EPINEPHRINE PF 1 MG/ML IJ SOLN: 0.3000 mg | Freq: Once | INTRAMUSCULAR | Status: DC | PRN

## 2022-01-30 MED ORDER — SODIUM CHLORIDE 0.9 % IV SOLN: INTRAVENOUS | Status: DC | PRN

## 2022-01-30 MED ORDER — DIPHENHYDRAMINE HCL 50 MG/ML IJ SOLN: 25.0000 mg | Freq: Once | INTRAMUSCULAR | Status: DC | PRN

## 2022-01-30 MED ORDER — METHYLPREDNISOLONE SODIUM SUCC 125 MG IJ SOLR: 125.0000 mg | Freq: Once | INTRAMUSCULAR | Status: DC | PRN

## 2022-01-30 MED ORDER — ALBUTEROL SULFATE (2.5 MG/3ML) 0.083% IN NEBU: 2.5000 mg | INHALATION_SOLUTION | Freq: Once | RESPIRATORY_TRACT | Status: DC | PRN

## 2022-01-30 MED ORDER — SODIUM CHLORIDE 0.9 % IV SOLN
300.0000 mg | INTRAVENOUS | Status: DC
  Administered 2022-01-30: 300 mg via INTRAVENOUS
  Filled 2022-01-30: qty 300

## 2022-01-31 ENCOUNTER — Other Ambulatory Visit: Payer: Self-pay

## 2022-01-31 DIAGNOSIS — Z3492 Encounter for supervision of normal pregnancy, unspecified, second trimester: Secondary | ICD-10-CM

## 2022-02-01 ENCOUNTER — Encounter: Payer: Self-pay | Admitting: Family Medicine

## 2022-02-01 ENCOUNTER — Other Ambulatory Visit: Payer: Self-pay

## 2022-02-01 ENCOUNTER — Ambulatory Visit (INDEPENDENT_AMBULATORY_CARE_PROVIDER_SITE_OTHER): Payer: Medicaid Other | Admitting: Family Medicine

## 2022-02-01 ENCOUNTER — Other Ambulatory Visit: Payer: Medicaid Other

## 2022-02-01 VITALS — BP 128/88 | HR 118 | Wt 185.0 lb

## 2022-02-01 DIAGNOSIS — O43199 Other malformation of placenta, unspecified trimester: Secondary | ICD-10-CM

## 2022-02-01 DIAGNOSIS — B360 Pityriasis versicolor: Secondary | ICD-10-CM

## 2022-02-01 DIAGNOSIS — Z3492 Encounter for supervision of normal pregnancy, unspecified, second trimester: Secondary | ICD-10-CM | POA: Diagnosis not present

## 2022-02-01 DIAGNOSIS — Z23 Encounter for immunization: Secondary | ICD-10-CM | POA: Diagnosis not present

## 2022-02-01 DIAGNOSIS — L700 Acne vulgaris: Secondary | ICD-10-CM

## 2022-02-01 DIAGNOSIS — R8271 Bacteriuria: Secondary | ICD-10-CM

## 2022-02-01 DIAGNOSIS — O99012 Anemia complicating pregnancy, second trimester: Secondary | ICD-10-CM

## 2022-02-01 HISTORY — DX: Pityriasis versicolor: B36.0

## 2022-02-01 MED ORDER — BENZOYL PEROXIDE 5 % EX LIQD: Freq: Two times a day (BID) | CUTANEOUS | 12 refills | Status: DC

## 2022-02-01 MED ORDER — FLUCONAZOLE 150 MG PO TABS: 150.0000 mg | ORAL_TABLET | Freq: Once | ORAL | 0 refills | Status: AC
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
## 2022-02-01 NOTE — Patient Instructions (Signed)

## 2022-02-02 LAB — CBC
Hematocrit: 30.2 % — ABNORMAL LOW (ref 34.0–46.6)
Hemoglobin: 9.4 g/dL — ABNORMAL LOW (ref 11.1–15.9)
MCH: 24.7 pg — ABNORMAL LOW (ref 26.6–33.0)
MCHC: 31.1 g/dL — ABNORMAL LOW (ref 31.5–35.7)
MCV: 79 fL (ref 79–97)
Platelets: 273 10*3/uL (ref 150–450)
RBC: 3.81 x10E6/uL (ref 3.77–5.28)
RDW: 22.3 % — ABNORMAL HIGH (ref 11.7–15.4)
WBC: 8.2 10*3/uL (ref 3.4–10.8)

## 2022-02-02 LAB — HIV ANTIBODY (ROUTINE TESTING W REFLEX): HIV Screen 4th Generation wRfx: NONREACTIVE

## 2022-02-02 LAB — RPR: RPR Ser Ql: NONREACTIVE

## 2022-02-03 ENCOUNTER — Other Ambulatory Visit: Payer: Self-pay

## 2022-02-03 ENCOUNTER — Other Ambulatory Visit: Payer: Medicaid Other

## 2022-02-03 DIAGNOSIS — Z3493 Encounter for supervision of normal pregnancy, unspecified, third trimester: Secondary | ICD-10-CM

## 2022-02-04 LAB — GLUCOSE TOLERANCE, 2 HOURS W/ 1HR
Glucose, 1 hour: 272 mg/dL — ABNORMAL HIGH (ref 70–179)
Glucose, 2 hour: 295 mg/dL — ABNORMAL HIGH (ref 70–152)
Glucose, Fasting: 105 mg/dL — ABNORMAL HIGH (ref 70–91)

## 2022-02-07 ENCOUNTER — Telehealth: Payer: Self-pay | Admitting: General Practice

## 2022-02-07 ENCOUNTER — Telehealth: Payer: Self-pay | Admitting: Family Medicine

## 2022-02-07 ENCOUNTER — Encounter: Payer: Self-pay | Admitting: Family Medicine

## 2022-02-07 DIAGNOSIS — O24419 Gestational diabetes mellitus in pregnancy, unspecified control: Secondary | ICD-10-CM | POA: Insufficient documentation

## 2022-02-07 DIAGNOSIS — Z8632 Personal history of gestational diabetes: Secondary | ICD-10-CM

## 2022-02-07 DIAGNOSIS — O24410 Gestational diabetes mellitus in pregnancy, diet controlled: Secondary | ICD-10-CM

## 2022-02-07 DIAGNOSIS — B379 Candidiasis, unspecified: Secondary | ICD-10-CM

## 2022-02-07 HISTORY — DX: Personal history of gestational diabetes: Z86.32

## 2022-02-07 NOTE — Telephone Encounter (Signed)
-----   Message from Clarnce Flock, MD sent at 02/07/2022  8:18 AM EDT ----- Please clarify with patient if she was actually fasting at time of these labs, if so will need to be set up with DM educator, have supplies sent, etc.

## 2022-02-07 NOTE — Telephone Encounter (Signed)
Called patient, no answer- left message to call us back for results and we have additional questions for her.

## 2022-02-07 NOTE — Telephone Encounter (Signed)
Patient returned phone call; see telephone note 02/07/22. Pt will need call back.

## 2022-02-07 NOTE — Telephone Encounter (Signed)
Patient was returning call about her test results.

## 2022-02-08 MED ORDER — ACCU-CHEK SOFTCLIX LANCETS MISC: 12 refills | Status: DC

## 2022-02-08 MED ORDER — ACCU-CHEK GUIDE W/DEVICE KIT: 1.0000 | PACK | Freq: Every day | 0 refills | Status: DC

## 2022-02-08 MED ORDER — TERCONAZOLE 0.4 % VA CREA: 1.0000 | TOPICAL_CREAM | Freq: Every day | VAGINAL | 0 refills | Status: DC

## 2022-02-08 MED ORDER — ACCU-CHEK GUIDE VI STRP: ORAL_STRIP | 12 refills | Status: DC

## 2022-02-08 NOTE — Addendum Note (Signed)
Addended by: Shelly Coss on: 02/08/2022 10:04 AM   Modules accepted: Orders

## 2022-02-08 NOTE — Telephone Encounter (Signed)
Patient called back into front office returning my call. Asked patient if she was fasting the morning of her GTT and she states yes. Discussed GDM diagnosis and supplies sent to pharmacy. Scheduled appt with Levada Dy for 10/3. Patient reports worsening yeast infection with cottage cheese discharge. She states the pill of diflucan didn't help. Terazol 7 sent to pharmacy and recommended she call back if symptoms haven't improved in a couple days. Patient verbalized understanding.

## 2022-02-10 ENCOUNTER — Ambulatory Visit: Payer: Medicaid Other | Attending: Obstetrics

## 2022-02-10 ENCOUNTER — Ambulatory Visit: Payer: Medicaid Other | Admitting: *Deleted

## 2022-02-10 VITALS — BP 133/71 | HR 104

## 2022-02-10 DIAGNOSIS — Z3493 Encounter for supervision of normal pregnancy, unspecified, third trimester: Secondary | ICD-10-CM | POA: Insufficient documentation

## 2022-02-10 DIAGNOSIS — O24419 Gestational diabetes mellitus in pregnancy, unspecified control: Secondary | ICD-10-CM | POA: Diagnosis not present

## 2022-02-10 DIAGNOSIS — Z3A28 28 weeks gestation of pregnancy: Secondary | ICD-10-CM | POA: Diagnosis not present

## 2022-02-10 DIAGNOSIS — O24410 Gestational diabetes mellitus in pregnancy, diet controlled: Secondary | ICD-10-CM | POA: Diagnosis not present

## 2022-02-10 DIAGNOSIS — Z362 Encounter for other antenatal screening follow-up: Secondary | ICD-10-CM | POA: Insufficient documentation

## 2022-02-10 DIAGNOSIS — O43199 Other malformation of placenta, unspecified trimester: Secondary | ICD-10-CM | POA: Insufficient documentation

## 2022-02-10 DIAGNOSIS — O43193 Other malformation of placenta, third trimester: Secondary | ICD-10-CM

## 2022-02-13 ENCOUNTER — Other Ambulatory Visit: Payer: Self-pay | Admitting: *Deleted

## 2022-02-13 DIAGNOSIS — O24419 Gestational diabetes mellitus in pregnancy, unspecified control: Secondary | ICD-10-CM

## 2022-02-14 ENCOUNTER — Ambulatory Visit (INDEPENDENT_AMBULATORY_CARE_PROVIDER_SITE_OTHER): Payer: Medicaid Other | Admitting: Registered"

## 2022-02-14 ENCOUNTER — Encounter: Payer: Medicaid Other | Attending: Family Medicine | Admitting: Registered"

## 2022-02-14 DIAGNOSIS — O24410 Gestational diabetes mellitus in pregnancy, diet controlled: Secondary | ICD-10-CM | POA: Insufficient documentation

## 2022-02-14 DIAGNOSIS — R8271 Bacteriuria: Secondary | ICD-10-CM | POA: Insufficient documentation

## 2022-02-14 DIAGNOSIS — O99013 Anemia complicating pregnancy, third trimester: Secondary | ICD-10-CM | POA: Diagnosis present

## 2022-02-14 DIAGNOSIS — O43193 Other malformation of placenta, third trimester: Secondary | ICD-10-CM | POA: Insufficient documentation

## 2022-02-14 DIAGNOSIS — A5901 Trichomonal vulvovaginitis: Secondary | ICD-10-CM | POA: Diagnosis not present

## 2022-02-14 DIAGNOSIS — D649 Anemia, unspecified: Secondary | ICD-10-CM | POA: Insufficient documentation

## 2022-02-14 DIAGNOSIS — Z3A32 32 weeks gestation of pregnancy: Secondary | ICD-10-CM | POA: Insufficient documentation

## 2022-02-14 DIAGNOSIS — O98313 Other infections with a predominantly sexual mode of transmission complicating pregnancy, third trimester: Secondary | ICD-10-CM | POA: Insufficient documentation

## 2022-02-14 DIAGNOSIS — O24419 Gestational diabetes mellitus in pregnancy, unspecified control: Secondary | ICD-10-CM

## 2022-02-14 NOTE — Progress Notes (Signed)
Patient was seen for Gestational Diabetes self-management on 02/14/2022  Start time 1645 and End time 1720   Estimated due date: 05/03/2022; [redacted]w[redacted]d  Clinical: Medications: prenatal, iron, vit C Medical History: anemia Labs: OGTT [id number], A1c 5.2%   Dietary and Lifestyle History: Pt states she lives with her mother. Pt states her mother had GDM with her and it went away after she was born. Pt states she has been looking on the internet for dietary guidance and has cut a lot of foods out. Pt is likely not eating the recommended amount of carbohydrates. Pt has made positive changes including increased her intake of greens and beans, and drinking mostly water  Physical Activity: not assessed Stress: not assessed Sleep: 10 pm - 7 am, but sleep time varies, may stay up late to watch a movie. Also states may wake up after a couple of hours and eat then go back to sleep.  24 hr Recall: not assessed First Meal:  Snack: Second meal: Snack: Third meal: Snack: Beverages:  NUTRITION INTERVENTION  Nutrition education (E-1) on the following topics:   Initial Follow-up  []  []  Definition of Gestational Diabetes []  []  Why dietary management is important in controlling blood glucose [x]  []  Effects each nutrient has on blood glucose levels []  []  Simple carbohydrates vs complex carbohydrates [x]  []  Fluid intake [x]  []  Creating a balanced meal plan [x]  []  Carbohydrate counting  [x]  []  When to check blood glucose levels [x]  []  Proper blood glucose monitoring techniques [x]  []  Effect of stress and stress reduction techniques  [x]  []  Exercise effect on blood glucose levels, appropriate exercise during pregnancy []  []  Importance of limiting caffeine and abstaining from alcohol and smoking []  []  Medications used for blood sugar control during pregnancy []  []  Hypoglycemia and rule of 15 []  []  Postpartum self care  Blood glucose monitor given: Accu-chek Guide Me Lot #[id number] Exp:  2022-10-04 CBG: 89 mg/dL   Patient instructed to monitor glucose levels: FBS: 60 - ? 95 mg/dL (some clinics use 90 for cutoff) 1 hour: ? 140 mg/dL 2 hour: ? 120 mg/dL  Patient received handouts: Nutrition Diabetes and Pregnancy Carbohydrate Counting List  Patient will be seen in 2 weeks for follow-up as needed.

## 2022-02-15 ENCOUNTER — Other Ambulatory Visit: Payer: Self-pay

## 2022-02-15 ENCOUNTER — Ambulatory Visit (INDEPENDENT_AMBULATORY_CARE_PROVIDER_SITE_OTHER): Payer: Medicaid Other | Admitting: Family Medicine

## 2022-02-15 ENCOUNTER — Encounter: Payer: Self-pay | Admitting: Family Medicine

## 2022-02-15 VITALS — BP 112/66 | HR 115 | Wt 181.6 lb

## 2022-02-15 DIAGNOSIS — R8271 Bacteriuria: Secondary | ICD-10-CM

## 2022-02-15 DIAGNOSIS — N898 Other specified noninflammatory disorders of vagina: Secondary | ICD-10-CM

## 2022-02-15 DIAGNOSIS — O99012 Anemia complicating pregnancy, second trimester: Secondary | ICD-10-CM

## 2022-02-15 DIAGNOSIS — O24419 Gestational diabetes mellitus in pregnancy, unspecified control: Secondary | ICD-10-CM

## 2022-02-15 DIAGNOSIS — O43199 Other malformation of placenta, unspecified trimester: Secondary | ICD-10-CM

## 2022-02-15 DIAGNOSIS — Z3493 Encounter for supervision of normal pregnancy, unspecified, third trimester: Secondary | ICD-10-CM

## 2022-02-15 MED ORDER — FLUCONAZOLE 150 MG PO TABS: 150.0000 mg | ORAL_TABLET | Freq: Once | ORAL | 0 refills | Status: AC
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
## 2022-02-15 NOTE — Patient Instructions (Signed)

## 2022-02-15 NOTE — Addendum Note (Signed)
Addended by: Louisa Second E on: 02/15/2022 05:15 PM   Modules accepted: Orders

## 2022-02-16 ENCOUNTER — Other Ambulatory Visit (HOSPITAL_COMMUNITY)
Admission: RE | Admit: 2022-02-16 | Discharge: 2022-02-16 | Disposition: A | Discharge status: Home / Self Care | Payer: Medicaid Other | Source: Ambulatory Visit | Attending: Family Medicine | Admitting: Family Medicine

## 2022-02-16 DIAGNOSIS — N898 Other specified noninflammatory disorders of vagina: Secondary | ICD-10-CM | POA: Insufficient documentation

## 2022-02-16 NOTE — Addendum Note (Signed)
Addended by: Jake Seats on: 02/16/2022 08:25 AM   Modules accepted: Orders

## 2022-02-17 LAB — CERVICOVAGINAL ANCILLARY ONLY
Bacterial Vaginitis (gardnerella): NEGATIVE
Candida Glabrata: NEGATIVE
Candida Vaginitis: POSITIVE — AB
Chlamydia: NEGATIVE
Comment: NEGATIVE
Comment: NEGATIVE
Comment: NEGATIVE
Comment: NEGATIVE
Comment: NEGATIVE
Comment: NORMAL
Neisseria Gonorrhea: NEGATIVE
Trichomonas: NEGATIVE

## 2022-02-28 ENCOUNTER — Other Ambulatory Visit: Payer: Medicaid Other

## 2022-02-28 ENCOUNTER — Encounter: Payer: Medicaid Other | Admitting: Family Medicine

## 2022-03-02 ENCOUNTER — Inpatient Hospital Stay (HOSPITAL_COMMUNITY)
Admission: AD | Admit: 2022-03-02 | Discharge: 2022-03-02 | Disposition: A | Discharge status: Home / Self Care | Payer: Medicaid Other | Attending: Obstetrics & Gynecology | Admitting: Obstetrics & Gynecology

## 2022-03-02 ENCOUNTER — Other Ambulatory Visit: Payer: Self-pay

## 2022-03-02 ENCOUNTER — Encounter (HOSPITAL_COMMUNITY): Payer: Self-pay | Admitting: Obstetrics & Gynecology

## 2022-03-02 DIAGNOSIS — O98819 Other maternal infectious and parasitic diseases complicating pregnancy, unspecified trimester: Secondary | ICD-10-CM

## 2022-03-02 DIAGNOSIS — Z3493 Encounter for supervision of normal pregnancy, unspecified, third trimester: Secondary | ICD-10-CM

## 2022-03-02 DIAGNOSIS — O98813 Other maternal infectious and parasitic diseases complicating pregnancy, third trimester: Secondary | ICD-10-CM | POA: Diagnosis not present

## 2022-03-02 DIAGNOSIS — A5901 Trichomonal vulvovaginitis: Secondary | ICD-10-CM | POA: Insufficient documentation

## 2022-03-02 DIAGNOSIS — Z3A31 31 weeks gestation of pregnancy: Secondary | ICD-10-CM | POA: Diagnosis not present

## 2022-03-02 DIAGNOSIS — O23593 Infection of other part of genital tract in pregnancy, third trimester: Secondary | ICD-10-CM | POA: Insufficient documentation

## 2022-03-02 DIAGNOSIS — B3731 Acute candidiasis of vulva and vagina: Secondary | ICD-10-CM | POA: Diagnosis not present

## 2022-03-02 DIAGNOSIS — Z113 Encounter for screening for infections with a predominantly sexual mode of transmission: Secondary | ICD-10-CM | POA: Diagnosis present

## 2022-03-02 DIAGNOSIS — O98313 Other infections with a predominantly sexual mode of transmission complicating pregnancy, third trimester: Secondary | ICD-10-CM | POA: Insufficient documentation

## 2022-03-02 LAB — WET PREP, GENITAL
Clue Cells Wet Prep HPF POC: NONE SEEN
Sperm: NONE SEEN
WBC, Wet Prep HPF POC: >=10 — AB (ref <10)
Yeast Wet Prep HPF POC: NONE SEEN

## 2022-03-02 LAB — URINALYSIS, ROUTINE W REFLEX MICROSCOPIC
Bilirubin Urine: NEGATIVE
Glucose, UA: NEGATIVE mg/dL
Hgb urine dipstick: NEGATIVE
Ketones, ur: NEGATIVE mg/dL
Nitrite: NEGATIVE
Protein, ur: NEGATIVE mg/dL
Specific Gravity, Urine: 1.006 (ref 1.005–1.030)
pH: 7 (ref 5.0–8.0)

## 2022-03-02 LAB — HEPATITIS B SURFACE ANTIGEN: Hepatitis B Surface Ag: NONREACTIVE

## 2022-03-02 MED ORDER — METRONIDAZOLE 500 MG PO TABS
2000.0000 mg | ORAL_TABLET | Freq: Once | ORAL | Status: AC
  Administered 2022-03-02: 2000 mg via ORAL
  Filled 2022-03-02: qty 4

## 2022-03-02 MED ORDER — FLUCONAZOLE 150 MG PO TABS: 150.0000 mg | ORAL_TABLET | Freq: Once | ORAL | 0 refills | Status: AC

## 2022-03-02 MED ORDER — FLUCONAZOLE 150 MG PO TABS
150.0000 mg | ORAL_TABLET | Freq: Once | ORAL | Status: AC
  Administered 2022-03-02: 150 mg via ORAL
  Filled 2022-03-02: qty 1
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
## 2022-03-02 NOTE — Discharge Instructions (Signed)

## 2022-03-02 NOTE — MAU Note (Addendum)
Natasha Kelly is a 21 y.o. at [redacted]w[redacted]d here in MAU reporting: lower abdominal cramping started last night and this AM started to have light bleeding. States it is when she wipes. No LOF. Normal FM today.  Onset of complaint: last night  Pain score: 6/10  Vitals:   03/02/22 1817  BP: 131/78  Pulse: (!) 105  Resp: 16  Temp: 98.3 F (36.8 C)  SpO2: 97%     FHT:144  Lab orders placed from triage: UA

## 2022-03-03 ENCOUNTER — Encounter: Payer: Self-pay | Admitting: Family Medicine

## 2022-03-03 ENCOUNTER — Telehealth (INDEPENDENT_AMBULATORY_CARE_PROVIDER_SITE_OTHER): Payer: Medicaid Other | Admitting: Family Medicine

## 2022-03-03 DIAGNOSIS — O24419 Gestational diabetes mellitus in pregnancy, unspecified control: Secondary | ICD-10-CM

## 2022-03-03 DIAGNOSIS — O99012 Anemia complicating pregnancy, second trimester: Secondary | ICD-10-CM

## 2022-03-03 DIAGNOSIS — R8271 Bacteriuria: Secondary | ICD-10-CM

## 2022-03-03 DIAGNOSIS — Z3493 Encounter for supervision of normal pregnancy, unspecified, third trimester: Secondary | ICD-10-CM

## 2022-03-03 DIAGNOSIS — O24410 Gestational diabetes mellitus in pregnancy, diet controlled: Secondary | ICD-10-CM

## 2022-03-03 DIAGNOSIS — O43199 Other malformation of placenta, unspecified trimester: Secondary | ICD-10-CM

## 2022-03-03 DIAGNOSIS — A5901 Trichomonal vulvovaginitis: Secondary | ICD-10-CM

## 2022-03-03 LAB — RPR: RPR Ser Ql: NONREACTIVE

## 2022-03-03 LAB — HEPATITIS C ANTIBODY: HCV Ab: NONREACTIVE

## 2022-03-03 LAB — GC/CHLAMYDIA PROBE AMP (~~LOC~~) NOT AT ARMC
Chlamydia: NEGATIVE
Comment: NEGATIVE
Comment: NORMAL
Neisseria Gonorrhea: NEGATIVE

## 2022-03-03 LAB — HIV ANTIBODY (ROUTINE TESTING W REFLEX): HIV Screen 4th Generation wRfx: NONREACTIVE

## 2022-03-03 NOTE — Progress Notes (Signed)
Virtual Visit via Telephone Note  I connected with Natasha Kelly on 03/03/22 at 10:30 AM EDT by telephone and verified that I am speaking with the correct person using two identifiers.  Location: Patient: home Provider: [facility]   I discussed the limitations, risks, security and privacy concerns of performing an evaluation and management service by telephone and the availability of in person appointments. I also discussed with the patient that there may be a patient responsible charge related to this service. The patient expressed understanding and agreed to proceed.   Patient reports good fetal movement, has been checking sugars and logging them. States sugars have been low for her fasting, and afternoon checks.    History of Present Illness:    Observations/Objective:   Assessment and Plan:    Follow Up Instructions:    I discussed the assessment and treatment plan with the patient. The patient was provided an opportunity to ask questions and all were answered. The patient agreed with the plan and demonstrated an understanding of the instructions.   The patient was advised to call back or seek an in-person evaluation if the symptoms worsen or if the condition fails to improve as anticipated.  I provided 5 minutes of non-face-to-face time during this encounter.   Jake Seats, CMA

## 2022-03-03 NOTE — Progress Notes (Signed)
Patient not seen. Per chart review, she went to MAU last night and was diagnosed with yeast and trichomonal vaginitis and treated for both.  Following w MFM and has growth scan scheduled for next week. Diet controlled GDM, will send her a message and see if she can send [place] a picture of her glucose log.

## 2022-03-08 ENCOUNTER — Inpatient Hospital Stay (HOSPITAL_COMMUNITY)
Admission: AD | Admit: 2022-03-08 | Discharge: 2022-03-08 | Disposition: A | Discharge status: Home / Self Care | Payer: Medicaid Other | Attending: Obstetrics and Gynecology | Admitting: Obstetrics and Gynecology

## 2022-03-08 ENCOUNTER — Other Ambulatory Visit: Payer: Self-pay

## 2022-03-08 DIAGNOSIS — N898 Other specified noninflammatory disorders of vagina: Secondary | ICD-10-CM | POA: Diagnosis not present

## 2022-03-08 DIAGNOSIS — O23593 Infection of other part of genital tract in pregnancy, third trimester: Secondary | ICD-10-CM | POA: Insufficient documentation

## 2022-03-08 DIAGNOSIS — Z3A32 32 weeks gestation of pregnancy: Secondary | ICD-10-CM | POA: Diagnosis not present

## 2022-03-08 DIAGNOSIS — O26893 Other specified pregnancy related conditions, third trimester: Secondary | ICD-10-CM | POA: Diagnosis not present

## 2022-03-08 DIAGNOSIS — Z3493 Encounter for supervision of normal pregnancy, unspecified, third trimester: Secondary | ICD-10-CM

## 2022-03-08 DIAGNOSIS — B3731 Acute candidiasis of vulva and vagina: Secondary | ICD-10-CM | POA: Insufficient documentation

## 2022-03-08 DIAGNOSIS — O98813 Other maternal infectious and parasitic diseases complicating pregnancy, third trimester: Secondary | ICD-10-CM | POA: Insufficient documentation

## 2022-03-08 DIAGNOSIS — Z113 Encounter for screening for infections with a predominantly sexual mode of transmission: Secondary | ICD-10-CM

## 2022-03-08 MED ORDER — MICONAZOLE NITRATE 2 % VA CREA: 1.0000 | TOPICAL_CREAM | Freq: Every day | VAGINAL | 0 refills | Status: DC

## 2022-03-08 NOTE — MAU Note (Signed)
Natasha Kelly is a 22 y.o. at [redacted]w[redacted]d here in MAU reporting: she was instructed to return to MAU in @ days for STI testing and if previous treatment didn't work she would receive a new treatment.  States has yellow cheesy discharge.  Denies VB or LOF.  Had mild cramping earlier today, none currently.  Endorses +FM. LMP: NA Onset of complaint: today Pain score: 0 Vitals:   03/08/22 1744  BP: 109/67  Pulse: 100  Resp: 18  Temp: 98 F (36.7 C)  SpO2: 97%     FHT: 135 bpm Lab orders placed from triage:   None
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
## 2022-03-09 ENCOUNTER — Ambulatory Visit: Payer: Medicaid Other | Attending: Obstetrics

## 2022-03-09 ENCOUNTER — Ambulatory Visit: Payer: Medicaid Other | Admitting: *Deleted

## 2022-03-09 VITALS — BP 115/76 | HR 112

## 2022-03-09 DIAGNOSIS — Z3A32 32 weeks gestation of pregnancy: Secondary | ICD-10-CM | POA: Insufficient documentation

## 2022-03-09 DIAGNOSIS — O24410 Gestational diabetes mellitus in pregnancy, diet controlled: Secondary | ICD-10-CM | POA: Diagnosis not present

## 2022-03-09 DIAGNOSIS — O43123 Velamentous insertion of umbilical cord, third trimester: Secondary | ICD-10-CM | POA: Diagnosis not present

## 2022-03-09 DIAGNOSIS — O43193 Other malformation of placenta, third trimester: Secondary | ICD-10-CM

## 2022-03-09 DIAGNOSIS — O24419 Gestational diabetes mellitus in pregnancy, unspecified control: Secondary | ICD-10-CM | POA: Insufficient documentation

## 2022-03-09 DIAGNOSIS — Z362 Encounter for other antenatal screening follow-up: Secondary | ICD-10-CM | POA: Diagnosis present

## 2022-03-09 DIAGNOSIS — Z3493 Encounter for supervision of normal pregnancy, unspecified, third trimester: Secondary | ICD-10-CM

## 2022-03-10 ENCOUNTER — Other Ambulatory Visit: Payer: Self-pay | Admitting: *Deleted

## 2022-03-10 DIAGNOSIS — O24410 Gestational diabetes mellitus in pregnancy, diet controlled: Secondary | ICD-10-CM

## 2022-03-10 DIAGNOSIS — O43199 Other malformation of placenta, unspecified trimester: Secondary | ICD-10-CM

## 2022-03-13 ENCOUNTER — Telehealth: Payer: Self-pay | Admitting: Family Medicine

## 2022-03-13 NOTE — Telephone Encounter (Signed)
Patient would like to change tomorrow appt to a virtual call

## 2022-03-13 NOTE — Telephone Encounter (Signed)
Pt notified through mychart.  [name], RNC

## 2022-03-14 ENCOUNTER — Telehealth (INDEPENDENT_AMBULATORY_CARE_PROVIDER_SITE_OTHER): Payer: Medicaid Other | Admitting: Family Medicine

## 2022-03-14 DIAGNOSIS — O99013 Anemia complicating pregnancy, third trimester: Secondary | ICD-10-CM

## 2022-03-14 DIAGNOSIS — Z3493 Encounter for supervision of normal pregnancy, unspecified, third trimester: Secondary | ICD-10-CM

## 2022-03-14 DIAGNOSIS — A5901 Trichomonal vulvovaginitis: Secondary | ICD-10-CM

## 2022-03-14 DIAGNOSIS — O24410 Gestational diabetes mellitus in pregnancy, diet controlled: Secondary | ICD-10-CM

## 2022-03-14 DIAGNOSIS — D649 Anemia, unspecified: Secondary | ICD-10-CM

## 2022-03-14 DIAGNOSIS — O43193 Other malformation of placenta, third trimester: Secondary | ICD-10-CM

## 2022-03-14 DIAGNOSIS — Z3A32 32 weeks gestation of pregnancy: Secondary | ICD-10-CM

## 2022-03-14 DIAGNOSIS — O43199 Other malformation of placenta, unspecified trimester: Secondary | ICD-10-CM

## 2022-03-14 DIAGNOSIS — O99012 Anemia complicating pregnancy, second trimester: Secondary | ICD-10-CM

## 2022-03-14 DIAGNOSIS — O98813 Other maternal infectious and parasitic diseases complicating pregnancy, third trimester: Secondary | ICD-10-CM

## 2022-03-14 DIAGNOSIS — R8271 Bacteriuria: Secondary | ICD-10-CM

## 2022-03-14 NOTE — Patient Instructions (Signed)
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
## 2022-03-22 ENCOUNTER — Encounter (HOSPITAL_COMMUNITY): Payer: Self-pay | Admitting: Obstetrics and Gynecology

## 2022-03-22 ENCOUNTER — Encounter: Payer: Medicaid Other | Admitting: Family Medicine

## 2022-03-22 ENCOUNTER — Inpatient Hospital Stay (HOSPITAL_COMMUNITY)
Admission: AD | Admit: 2022-03-22 | Discharge: 2022-03-22 | Disposition: A | Discharge status: Home / Self Care | Payer: Medicaid Other | Attending: Obstetrics and Gynecology | Admitting: Obstetrics and Gynecology

## 2022-03-22 DIAGNOSIS — R1030 Lower abdominal pain, unspecified: Secondary | ICD-10-CM | POA: Insufficient documentation

## 2022-03-22 DIAGNOSIS — Z3689 Encounter for other specified antenatal screening: Secondary | ICD-10-CM | POA: Insufficient documentation

## 2022-03-22 DIAGNOSIS — O26893 Other specified pregnancy related conditions, third trimester: Secondary | ICD-10-CM | POA: Insufficient documentation

## 2022-03-22 DIAGNOSIS — Z3A34 34 weeks gestation of pregnancy: Secondary | ICD-10-CM | POA: Insufficient documentation

## 2022-03-22 DIAGNOSIS — Z3493 Encounter for supervision of normal pregnancy, unspecified, third trimester: Secondary | ICD-10-CM

## 2022-03-22 LAB — POCT FERN TEST
POCT Fern Test: NEGATIVE
POCT Fern Test: NEGATIVE

## 2022-03-22 LAB — AMNISURE RUPTURE OF MEMBRANE (ROM) NOT AT ARMC: Amnisure ROM: NEGATIVE

## 2022-03-22 NOTE — Discharge Instructions (Signed)

## 2022-03-22 NOTE — MAU Note (Addendum)
...  Natasha Kelly is a 21 y.o. at 101w0d here in MAU reporting: Small gush of clear fluids that occurred at 0300 this morning when she rolled over in bed. She reports around 1000 she felt another small trickle of fluids at 1000 and reports that time it was "yellowish." She is also endorsing intermittent mid lower abdominal pain that feels like a cramp for the past two days. She reports she feels this pain every 5-10 minutes. Denies vaginal odors and vaginal itching. Denies VB or LOF. +FM.  Pain score: 6/10 lower abdomen  FHT: 134 doppler

## 2022-03-22 NOTE — MAU Provider Note (Signed)
History     CSN: [id number]  Arrival date and time: 03/22/22 1243   Event Date/Time   First Provider Initiated Contact with Patient 03/22/22 1322      Chief Complaint  Patient presents with   Rupture of Membranes   Contractions   HPI Natasha Kelly is a 21 y.o. G2P0010 at 69w0dwho presents to MAU with chief complaint of leaking of fluid. Patient reports a gush of fluid at 0300. She felt another smaller gush of fluid at 1000. She denies active leaking of fluid.  Patient also reports lower abdominal "cramps". Onset of complaint: two days ago.  Pain score is 6/10 and locus of pain is across her abdomen, just below the level of her umbilicus. She denies aggravating or alleviating factors. She has not taken medication for this complaint.  Patient receives care with [facility].   OB History     Gravida  2   Para  0   Term  0   Preterm  0   AB  1   Living  0      SAB  1   IAB  0   Ectopic  0   Multiple  0   Live Births  0           Past Medical History:  Diagnosis Date   Anemia    Bacterial vaginosis    Chlamydia    Gonorrhea    Trichomonas infection    UTI (urinary tract infection)     Past Surgical History:  Procedure Laterality Date   NO PAST SURGERIES      Family History  Problem Relation Age of Onset   Healthy Mother    Healthy Father    Asthma Sister    Cancer Maternal Grandmother    Diabetes Neg Hx    Heart disease Neg Hx    Hypertension Neg Hx    Stroke Neg Hx     Social History   Tobacco Use   Smoking status: Never   Smokeless tobacco: Never  Vaping Use   Vaping Use: Never used  Substance Use Topics   Alcohol use: Never   Drug use: Never    Allergies: No Known Allergies  Medications Prior to Admission  Medication Sig Dispense Refill Last Dose   Prenatal Vit-Fe Fumarate-FA (MULTIVITAMIN-PRENATAL) 27-0.8 MG TABS tablet Take 1 tablet by mouth daily at 12 noon.   03/22/2022 at 1000   vitamin C (ASCORBIC ACID) 250 MG tablet  Take 1 tablet (250 mg total) by mouth daily. 30 tablet 0 03/22/2022 at 1000   Accu-Chek Softclix Lancets lancets Use as instructed QID 100 each 12    benzoyl peroxide 5 % external liquid Apply topically 2 (two) times daily. (Patient not taking: Reported on 02/15/2022) 142 g 12    Blood Glucose Monitoring Suppl (ACCU-CHEK GUIDE) w/Device KIT 1 Device by Does not apply route daily. 1 kit 0    ferrous sulfate 325 (65 FE) MG tablet Take 1 tablet (325 mg total) by mouth every other day. 30 tablet 0    glucose blood (ACCU-CHEK GUIDE) test strip Use as instructed QID 100 each 12    miconazole (MONISTAT 7) 2 % vaginal cream Place 1 Applicatorful vaginally at bedtime. (Patient not taking: Reported on 03/09/2022) 45 g 0     Review of Systems  Gastrointestinal:  Positive for abdominal pain.  Genitourinary:  Positive for vaginal discharge.  All other systems reviewed and are negative.  Physical Exam   Blood pressure  119/70, pulse (!) 106, temperature 98.2 F (36.8 C), temperature source Oral, resp. rate 15, height _0  (1.6 m), weight 79.4 kg, last menstrual period 07/10/2021, SpO2 98 %, unknown if currently breastfeeding.  Physical Exam Vitals and nursing note reviewed. Exam conducted with a chaperone present.  Constitutional:      Appearance: Normal appearance. She is not ill-appearing.  Cardiovascular:     Rate and Rhythm: Normal rate.  Pulmonary:     Effort: Pulmonary effort is normal.  Abdominal:     Comments: Gravid  Genitourinary:    Comments: Pelvic exam: External genitalia normal, vaginal walls pink and well rugated, cervix visually closed, no lesions noted. No abnormal discharge observed, negative pooling.   Skin:    Capillary Refill: Capillary refill takes less than 2 seconds.  Neurological:     Mental Status: She is alert and oriented to person, place, and time.  Psychiatric:        Mood and Affect: Mood normal.        Behavior: Behavior normal.        Thought Content: Thought  content normal.        Judgment: Judgment normal.     MAU Course  Procedures  MDM  --Reactive tracing: baseline 140, mod var, + accels, no decels --Toco: uterine irritability, rare contraction  Orders Placed This Encounter  Procedures   Amnisure rupture of membrane (rom)not at [facility] Test   Discharge patient   Patient Vitals for the past 24 hrs:  BP Temp Temp src Pulse Resp SpO2 Height Weight  03/22/22 1315 119/70 -- -- (!) 106 -- -- -- --  03/22/22 1257 129/76 98.2 F (36.8 C) Oral (!) 108 15 98 % _1  (1.6 m) 79.4 kg   Results for orders placed or performed during the hospital encounter of 03/22/22 (from the past 24 hour(s))  Fern Test     Status: None   Collection Time: 03/22/22  1:18 PM  Result Value Ref Range   POCT Fern Test Negative = intact amniotic membranes   Amnisure rupture of membrane (rom)not at [facility]     Status: None   Collection Time: 03/22/22  1:47 PM  Result Value Ref Range   Amnisure ROM NEGATIVE   Fern Test     Status: None   Collection Time: 03/22/22  1:50 PM  Result Value Ref Range   POCT Fern Test Negative = intact amniotic membranes    Assessment and Plan  --21 y.o. G2P0010 at [redacted]w[redacted]d --Intact amniotic sac (neg pooling, neg fern, neg Amnisure) --Reactive tracing --Closed cervix --Round ligament pain, musculoskeletal pain WNL for GA --Discharge home in stable condition  F/U: --Next Ob appt is 03/29/2022  SDarlina Rumpf [place] MSN, CNM 03/22/2022, 4:01 PM

## 2022-03-29 ENCOUNTER — Ambulatory Visit (INDEPENDENT_AMBULATORY_CARE_PROVIDER_SITE_OTHER): Payer: Medicaid Other | Admitting: Family Medicine

## 2022-03-29 ENCOUNTER — Other Ambulatory Visit (HOSPITAL_COMMUNITY)
Admission: RE | Admit: 2022-03-29 | Discharge: 2022-03-29 | Disposition: A | Discharge status: Home / Self Care | Payer: Medicaid Other | Source: Ambulatory Visit | Attending: Family Medicine | Admitting: Family Medicine

## 2022-03-29 ENCOUNTER — Encounter: Payer: Self-pay | Admitting: Family Medicine

## 2022-03-29 ENCOUNTER — Other Ambulatory Visit: Payer: Self-pay

## 2022-03-29 VITALS — BP 118/77 | HR 91 | Wt 174.7 lb

## 2022-03-29 DIAGNOSIS — R8271 Bacteriuria: Secondary | ICD-10-CM

## 2022-03-29 DIAGNOSIS — B3731 Acute candidiasis of vulva and vagina: Secondary | ICD-10-CM

## 2022-03-29 DIAGNOSIS — Z5901 Sheltered homelessness: Secondary | ICD-10-CM | POA: Diagnosis present

## 2022-03-29 DIAGNOSIS — O24410 Gestational diabetes mellitus in pregnancy, diet controlled: Secondary | ICD-10-CM

## 2022-03-29 DIAGNOSIS — Z3493 Encounter for supervision of normal pregnancy, unspecified, third trimester: Secondary | ICD-10-CM | POA: Insufficient documentation

## 2022-03-29 DIAGNOSIS — O43199 Other malformation of placenta, unspecified trimester: Secondary | ICD-10-CM

## 2022-03-29 DIAGNOSIS — O99012 Anemia complicating pregnancy, second trimester: Secondary | ICD-10-CM

## 2022-03-29 DIAGNOSIS — A5901 Trichomonal vulvovaginitis: Secondary | ICD-10-CM

## 2022-03-29 MED ORDER — PRENATAL 27-1 MG PO TABS: 1.0000 | ORAL_TABLET | Freq: Every day | ORAL | 4 refills | Status: DC
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
## 2022-03-29 NOTE — Patient Instructions (Signed)

## 2022-03-30 LAB — CERVICOVAGINAL ANCILLARY ONLY
Bacterial Vaginitis (gardnerella): NEGATIVE
Candida Glabrata: NEGATIVE
Candida Vaginitis: POSITIVE — AB
Chlamydia: NEGATIVE
Comment: NEGATIVE
Comment: NEGATIVE
Comment: NEGATIVE
Comment: NEGATIVE
Comment: NEGATIVE
Comment: NORMAL
Neisseria Gonorrhea: NEGATIVE
Trichomonas: POSITIVE — AB

## 2022-03-30 LAB — CBC
Hematocrit: 35 % (ref 34.0–46.6)
Hemoglobin: 11.6 g/dL (ref 11.1–15.9)
MCH: 26.3 pg — ABNORMAL LOW (ref 26.6–33.0)
MCHC: 33.1 g/dL (ref 31.5–35.7)
MCV: 79 fL (ref 79–97)
Platelets: 291 10*3/uL (ref 150–450)
RBC: 4.41 x10E6/uL (ref 3.77–5.28)
RDW: 16.8 % — ABNORMAL HIGH (ref 11.7–15.4)
WBC: 5.7 10*3/uL (ref 3.4–10.8)

## 2022-03-31 MED ORDER — METRONIDAZOLE 500 MG PO TABS: 500.0000 mg | ORAL_TABLET | Freq: Two times a day (BID) | ORAL | 0 refills | Status: AC

## 2022-03-31 MED ORDER — FLUCONAZOLE 150 MG PO TABS: 150.0000 mg | ORAL_TABLET | Freq: Once | ORAL | 0 refills | Status: DC

## 2022-03-31 MED ORDER — FLUCONAZOLE 150 MG PO TABS: 150.0000 mg | ORAL_TABLET | Freq: Once | ORAL | 0 refills | Status: AC

## 2022-03-31 NOTE — Addendum Note (Signed)
Addended by: Merian Capron on: 03/31/2022 08:16 AM   Modules accepted: Orders

## 2022-03-31 NOTE — Addendum Note (Signed)
Addended by: Isabell Jarvis on: 03/31/2022 11:03 AM   Modules accepted: Orders

## 2022-04-10 ENCOUNTER — Ambulatory Visit: Payer: Medicaid Other | Admitting: *Deleted

## 2022-04-10 ENCOUNTER — Encounter: Payer: Self-pay | Admitting: *Deleted

## 2022-04-10 ENCOUNTER — Ambulatory Visit: Payer: Medicaid Other | Attending: Maternal & Fetal Medicine

## 2022-04-10 VITALS — BP 121/67 | HR 93

## 2022-04-10 DIAGNOSIS — O43199 Other malformation of placenta, unspecified trimester: Secondary | ICD-10-CM | POA: Insufficient documentation

## 2022-04-10 DIAGNOSIS — O24410 Gestational diabetes mellitus in pregnancy, diet controlled: Secondary | ICD-10-CM | POA: Diagnosis not present

## 2022-04-10 DIAGNOSIS — O43193 Other malformation of placenta, third trimester: Secondary | ICD-10-CM | POA: Diagnosis not present

## 2022-04-10 DIAGNOSIS — Z3A36 36 weeks gestation of pregnancy: Secondary | ICD-10-CM | POA: Diagnosis not present

## 2022-04-10 DIAGNOSIS — Z3493 Encounter for supervision of normal pregnancy, unspecified, third trimester: Secondary | ICD-10-CM

## 2022-04-10 MED ORDER — ACCU-CHEK GUIDE VI STRP: ORAL_STRIP | 12 refills | Status: DC

## 2022-04-10 MED ORDER — ACCU-CHEK SOFTCLIX LANCETS MISC: 12 refills | Status: DC

## 2022-04-13 ENCOUNTER — Encounter: Payer: Medicaid Other | Admitting: Family Medicine

## 2022-04-14 ENCOUNTER — Ambulatory Visit (INDEPENDENT_AMBULATORY_CARE_PROVIDER_SITE_OTHER): Payer: Medicaid Other | Admitting: Family Medicine

## 2022-04-14 ENCOUNTER — Other Ambulatory Visit: Payer: Self-pay

## 2022-04-14 ENCOUNTER — Other Ambulatory Visit (HOSPITAL_COMMUNITY)
Admission: RE | Admit: 2022-04-14 | Discharge: 2022-04-14 | Disposition: A | Discharge status: Home / Self Care | Payer: Medicaid Other | Source: Ambulatory Visit | Attending: Family Medicine | Admitting: Family Medicine

## 2022-04-14 VITALS — BP 111/73 | HR 105 | Wt 176.4 lb

## 2022-04-14 DIAGNOSIS — O24410 Gestational diabetes mellitus in pregnancy, diet controlled: Secondary | ICD-10-CM | POA: Diagnosis not present

## 2022-04-14 DIAGNOSIS — R8271 Bacteriuria: Secondary | ICD-10-CM | POA: Insufficient documentation

## 2022-04-14 DIAGNOSIS — Z3A37 37 weeks gestation of pregnancy: Secondary | ICD-10-CM | POA: Diagnosis not present

## 2022-04-14 DIAGNOSIS — Z3493 Encounter for supervision of normal pregnancy, unspecified, third trimester: Secondary | ICD-10-CM

## 2022-04-14 DIAGNOSIS — O43193 Other malformation of placenta, third trimester: Secondary | ICD-10-CM | POA: Insufficient documentation

## 2022-04-14 DIAGNOSIS — O43199 Other malformation of placenta, unspecified trimester: Secondary | ICD-10-CM
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
## 2022-04-17 LAB — CERVICOVAGINAL ANCILLARY ONLY
Bacterial Vaginitis (gardnerella): NEGATIVE
Candida Glabrata: NEGATIVE
Candida Vaginitis: NEGATIVE
Chlamydia: NEGATIVE
Comment: NEGATIVE
Comment: NEGATIVE
Comment: NEGATIVE
Comment: NEGATIVE
Comment: NEGATIVE
Comment: NORMAL
Neisseria Gonorrhea: NEGATIVE
Trichomonas: NEGATIVE

## 2022-04-19 ENCOUNTER — Other Ambulatory Visit: Payer: Self-pay | Admitting: Advanced Practice Midwife

## 2022-04-19 ENCOUNTER — Encounter: Payer: Medicaid Other | Admitting: Family Medicine

## 2022-04-20 ENCOUNTER — Encounter: Payer: Medicaid Other | Admitting: Family Medicine

## 2022-04-21 ENCOUNTER — Encounter: Payer: Medicaid Other | Admitting: Family Medicine

## 2022-04-21 ENCOUNTER — Ambulatory Visit (INDEPENDENT_AMBULATORY_CARE_PROVIDER_SITE_OTHER): Payer: Medicaid Other | Admitting: Family Medicine

## 2022-04-21 ENCOUNTER — Telehealth (HOSPITAL_COMMUNITY): Payer: Self-pay | Admitting: *Deleted

## 2022-04-21 ENCOUNTER — Encounter: Payer: Self-pay | Admitting: Family Medicine

## 2022-04-21 ENCOUNTER — Encounter (HOSPITAL_COMMUNITY): Payer: Self-pay | Admitting: *Deleted

## 2022-04-21 ENCOUNTER — Other Ambulatory Visit: Payer: Self-pay

## 2022-04-21 VITALS — BP 116/78 | HR 102 | Wt 176.2 lb

## 2022-04-21 DIAGNOSIS — R8271 Bacteriuria: Secondary | ICD-10-CM

## 2022-04-21 DIAGNOSIS — Z3493 Encounter for supervision of normal pregnancy, unspecified, third trimester: Secondary | ICD-10-CM

## 2022-04-21 DIAGNOSIS — Z3A38 38 weeks gestation of pregnancy: Secondary | ICD-10-CM

## 2022-04-21 DIAGNOSIS — O43199 Other malformation of placenta, unspecified trimester: Secondary | ICD-10-CM

## 2022-04-21 DIAGNOSIS — O24410 Gestational diabetes mellitus in pregnancy, diet controlled: Secondary | ICD-10-CM

## 2022-04-21 NOTE — Patient Instructions (Signed)

## 2022-04-21 NOTE — Telephone Encounter (Signed)
Preadmission screen  
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
## 2022-04-24 ENCOUNTER — Encounter: Payer: Self-pay | Admitting: *Deleted

## 2022-04-26 ENCOUNTER — Encounter: Payer: Medicaid Other | Admitting: Family Medicine

## 2022-04-27 ENCOUNTER — Inpatient Hospital Stay (HOSPITAL_COMMUNITY): Payer: Medicaid Other | Admitting: Anesthesiology

## 2022-04-27 ENCOUNTER — Encounter (HOSPITAL_COMMUNITY): Payer: Self-pay | Admitting: Family Medicine

## 2022-04-27 ENCOUNTER — Inpatient Hospital Stay (HOSPITAL_COMMUNITY): Payer: Medicaid Other

## 2022-04-27 ENCOUNTER — Other Ambulatory Visit: Payer: Self-pay

## 2022-04-27 ENCOUNTER — Inpatient Hospital Stay (HOSPITAL_COMMUNITY)
Admission: RE | Admit: 2022-04-27 | Discharge: 2022-04-29 | DRG: 807 | Disposition: A | Discharge status: Home / Self Care | Payer: Medicaid Other | Attending: Obstetrics and Gynecology | Admitting: Obstetrics and Gynecology

## 2022-04-27 DIAGNOSIS — O322XX0 Maternal care for transverse and oblique lie, not applicable or unspecified: Secondary | ICD-10-CM | POA: Diagnosis present

## 2022-04-27 DIAGNOSIS — O326XX0 Maternal care for compound presentation, not applicable or unspecified: Secondary | ICD-10-CM | POA: Diagnosis not present

## 2022-04-27 DIAGNOSIS — Z3A39 39 weeks gestation of pregnancy: Secondary | ICD-10-CM

## 2022-04-27 DIAGNOSIS — O99824 Streptococcus B carrier state complicating childbirth: Secondary | ICD-10-CM | POA: Diagnosis present

## 2022-04-27 DIAGNOSIS — O24424 Gestational diabetes mellitus in childbirth, insulin controlled: Secondary | ICD-10-CM | POA: Diagnosis not present

## 2022-04-27 DIAGNOSIS — O43123 Velamentous insertion of umbilical cord, third trimester: Secondary | ICD-10-CM | POA: Diagnosis present

## 2022-04-27 DIAGNOSIS — O4202 Full-term premature rupture of membranes, onset of labor within 24 hours of rupture: Secondary | ICD-10-CM | POA: Diagnosis not present

## 2022-04-27 DIAGNOSIS — O99820 Streptococcus B carrier state complicating pregnancy: Secondary | ICD-10-CM | POA: Diagnosis not present

## 2022-04-27 DIAGNOSIS — O24420 Gestational diabetes mellitus in childbirth, diet controlled: Principal | ICD-10-CM | POA: Diagnosis present

## 2022-04-27 DIAGNOSIS — O9902 Anemia complicating childbirth: Secondary | ICD-10-CM | POA: Diagnosis present

## 2022-04-27 DIAGNOSIS — O99012 Anemia complicating pregnancy, second trimester: Secondary | ICD-10-CM | POA: Diagnosis present

## 2022-04-27 DIAGNOSIS — Z8632 Personal history of gestational diabetes: Secondary | ICD-10-CM | POA: Diagnosis present

## 2022-04-27 DIAGNOSIS — O24419 Gestational diabetes mellitus in pregnancy, unspecified control: Secondary | ICD-10-CM | POA: Diagnosis present

## 2022-04-27 DIAGNOSIS — R8271 Bacteriuria: Secondary | ICD-10-CM | POA: Diagnosis present

## 2022-04-27 DIAGNOSIS — O24410 Gestational diabetes mellitus in pregnancy, diet controlled: Secondary | ICD-10-CM

## 2022-04-27 DIAGNOSIS — O99214 Obesity complicating childbirth: Secondary | ICD-10-CM | POA: Diagnosis present

## 2022-04-27 LAB — GLUCOSE, CAPILLARY
Glucose-Capillary: 89 mg/dL (ref 70–99)
Glucose-Capillary: 106 mg/dL — ABNORMAL HIGH (ref 70–99)
Glucose-Capillary: 81 mg/dL (ref 70–99)
Glucose-Capillary: 54 mg/dL — ABNORMAL LOW (ref 70–99)
Glucose-Capillary: 73 mg/dL (ref 70–99)

## 2022-04-27 LAB — CBC
HCT: 38 % (ref 36.0–46.0)
Hemoglobin: 12.8 g/dL (ref 12.0–15.0)
MCH: 27.7 pg (ref 26.0–34.0)
MCHC: 33.7 g/dL (ref 30.0–36.0)
MCV: 82.3 fL (ref 80.0–100.0)
Platelets: 243 10*3/uL (ref 150–400)
RBC: 4.62 MIL/uL (ref 3.87–5.11)
RDW: 16.9 % — ABNORMAL HIGH (ref 11.5–15.5)
WBC: 6.2 10*3/uL (ref 4.0–10.5)
nRBC: 0 % (ref 0.0–0.2)

## 2022-04-27 LAB — RPR: RPR Ser Ql: NONREACTIVE

## 2022-04-27 LAB — TYPE AND SCREEN
ABO/RH(D): A POS
Antibody Screen: NEGATIVE

## 2022-04-27 MED ORDER — TERBUTALINE SULFATE 1 MG/ML IJ SOLN: 0.2500 mg | Freq: Once | INTRAMUSCULAR | Status: DC | PRN

## 2022-04-27 MED ORDER — LACTATED RINGERS IV SOLN: INTRAVENOUS | Status: DC

## 2022-04-27 MED ORDER — MAGNESIUM HYDROXIDE 400 MG/5ML PO SUSP: 30.0000 mL | ORAL | Status: DC | PRN

## 2022-04-27 MED ORDER — LIDOCAINE HCL (PF) 1 % IJ SOLN: 30.0000 mL | INTRAMUSCULAR | Status: DC | PRN

## 2022-04-27 MED ORDER — OXYTOCIN-SODIUM CHLORIDE 30-0.9 UT/500ML-% IV SOLN
2.5000 [IU]/h | INTRAVENOUS | Status: DC
  Administered 2022-04-27: 2.5 [IU]/h via INTRAVENOUS
  Filled 2022-04-27: qty 500

## 2022-04-27 MED ORDER — OXYTOCIN-SODIUM CHLORIDE 30-0.9 UT/500ML-% IV SOLN
1.0000 m[IU]/min | INTRAVENOUS | Status: DC
  Administered 2022-04-27: 2 m[IU]/min via INTRAVENOUS

## 2022-04-27 MED ORDER — IBUPROFEN 600 MG PO TABS
600.0000 mg | ORAL_TABLET | Freq: Four times a day (QID) | ORAL | Status: DC
  Administered 2022-04-28 – 2022-04-29 (×7): 600 mg via ORAL
  Filled 2022-04-27 (×7): qty 1

## 2022-04-27 MED ORDER — ONDANSETRON HCL 4 MG PO TABS: 4.0000 mg | ORAL_TABLET | ORAL | Status: DC | PRN

## 2022-04-27 MED ORDER — PHENYLEPHRINE 80 MCG/ML (10ML) SYRINGE FOR IV PUSH (FOR BLOOD PRESSURE SUPPORT): 80.0000 ug | PREFILLED_SYRINGE | INTRAVENOUS | Status: DC | PRN

## 2022-04-27 MED ORDER — SENNOSIDES-DOCUSATE SODIUM 8.6-50 MG PO TABS
2.0000 | ORAL_TABLET | ORAL | Status: DC
  Administered 2022-04-27 – 2022-04-28 (×2): 2 via ORAL
  Filled 2022-04-27 (×2): qty 2

## 2022-04-27 MED ORDER — LACTATED RINGERS IV SOLN: 500.0000 mL | Freq: Once | INTRAVENOUS | Status: DC

## 2022-04-27 MED ORDER — OXYCODONE-ACETAMINOPHEN 5-325 MG PO TABS: 2.0000 | ORAL_TABLET | ORAL | Status: DC | PRN

## 2022-04-27 MED ORDER — SODIUM CHLORIDE 0.9 % IV SOLN
5.0000 10*6.[IU] | Freq: Once | INTRAVENOUS | Status: AC
  Administered 2022-04-27: 5 10*6.[IU] via INTRAVENOUS
  Filled 2022-04-27: qty 5

## 2022-04-27 MED ORDER — MISOPROSTOL 25 MCG QUARTER TABLET
25.0000 ug | ORAL_TABLET | Freq: Once | ORAL | Status: AC
  Administered 2022-04-27: 25 ug via VAGINAL
  Filled 2022-04-27: qty 1

## 2022-04-27 MED ORDER — ONDANSETRON HCL 4 MG/2ML IJ SOLN: 4.0000 mg | INTRAMUSCULAR | Status: DC | PRN

## 2022-04-27 MED ORDER — EPHEDRINE 5 MG/ML INJ: 10.0000 mg | INTRAVENOUS | Status: DC | PRN

## 2022-04-27 MED ORDER — WITCH HAZEL-GLYCERIN EX PADS: 1.0000 | MEDICATED_PAD | CUTANEOUS | Status: DC | PRN

## 2022-04-27 MED ORDER — DIBUCAINE (PERIANAL) 1 % EX OINT: 1.0000 | TOPICAL_OINTMENT | CUTANEOUS | Status: DC | PRN

## 2022-04-27 MED ORDER — FLEET ENEMA 7-19 GM/118ML RE ENEM: 1.0000 | ENEMA | RECTAL | Status: DC | PRN

## 2022-04-27 MED ORDER — SIMETHICONE 80 MG PO CHEW: 80.0000 mg | CHEWABLE_TABLET | ORAL | Status: DC | PRN

## 2022-04-27 MED ORDER — SALINE SPRAY 0.65 % NA SOLN
1.0000 | NASAL | Status: DC | PRN
  Filled 2022-04-27: qty 44

## 2022-04-27 MED ORDER — OXYCODONE HCL 5 MG PO TABS
5.0000 mg | ORAL_TABLET | ORAL | Status: DC | PRN
  Administered 2022-04-28: 5 mg via ORAL
  Filled 2022-04-27: qty 1

## 2022-04-27 MED ORDER — DIPHENHYDRAMINE HCL 50 MG/ML IJ SOLN: 12.5000 mg | INTRAMUSCULAR | Status: DC | PRN

## 2022-04-27 MED ORDER — COCONUT OIL OIL: 1.0000 | TOPICAL_OIL | Status: DC | PRN

## 2022-04-27 MED ORDER — OXYCODONE-ACETAMINOPHEN 5-325 MG PO TABS: 1.0000 | ORAL_TABLET | ORAL | Status: DC | PRN

## 2022-04-27 MED ORDER — OXYCODONE HCL 5 MG PO TABS: 10.0000 mg | ORAL_TABLET | ORAL | Status: DC | PRN

## 2022-04-27 MED ORDER — ACETAMINOPHEN 325 MG PO TABS: 650.0000 mg | ORAL_TABLET | ORAL | Status: DC | PRN

## 2022-04-27 MED ORDER — ONDANSETRON HCL 4 MG/2ML IJ SOLN
4.0000 mg | Freq: Four times a day (QID) | INTRAMUSCULAR | Status: DC | PRN
  Administered 2022-04-27: 4 mg via INTRAVENOUS
  Filled 2022-04-27: qty 2

## 2022-04-27 MED ORDER — BENZOCAINE-MENTHOL 20-0.5 % EX AERO: 1.0000 | INHALATION_SPRAY | CUTANEOUS | Status: DC | PRN

## 2022-04-27 MED ORDER — PENICILLIN G POT IN DEXTROSE 60000 UNIT/ML IV SOLN
3.0000 10*6.[IU] | INTRAVENOUS | Status: DC
  Administered 2022-04-27 (×2): 3 10*6.[IU] via INTRAVENOUS
  Filled 2022-04-27 (×4): qty 50

## 2022-04-27 MED ORDER — OXYTOCIN BOLUS FROM INFUSION
333.0000 mL | Freq: Once | INTRAVENOUS | Status: AC
  Administered 2022-04-27: 333 mL via INTRAVENOUS

## 2022-04-27 MED ORDER — LIDOCAINE HCL (PF) 1 % IJ SOLN
INTRAMUSCULAR | Status: DC | PRN
  Administered 2022-04-27: 2 mL via EPIDURAL
  Administered 2022-04-27: 3 mL via EPIDURAL
  Administered 2022-04-27: 5 mL via EPIDURAL

## 2022-04-27 MED ORDER — MISOPROSTOL 50MCG HALF TABLET
50.0000 ug | ORAL_TABLET | ORAL | Status: DC | PRN
  Administered 2022-04-27: 50 ug via ORAL
  Filled 2022-04-27: qty 1

## 2022-04-27 MED ORDER — ACETAMINOPHEN 325 MG PO TABS
650.0000 mg | ORAL_TABLET | ORAL | Status: DC | PRN
  Administered 2022-04-27 – 2022-04-29 (×5): 650 mg via ORAL
  Filled 2022-04-27 (×5): qty 2

## 2022-04-27 MED ORDER — MISOPROSTOL 50MCG HALF TABLET
50.0000 ug | ORAL_TABLET | Freq: Once | ORAL | Status: AC
  Administered 2022-04-27: 50 ug via ORAL
  Filled 2022-04-27: qty 1

## 2022-04-27 MED ORDER — DIPHENHYDRAMINE HCL 25 MG PO CAPS: 25.0000 mg | ORAL_CAPSULE | Freq: Four times a day (QID) | ORAL | Status: DC | PRN

## 2022-04-27 MED ORDER — SOD CITRATE-CITRIC ACID 500-334 MG/5ML PO SOLN: 30.0000 mL | ORAL | Status: DC | PRN

## 2022-04-27 MED ORDER — FENTANYL CITRATE (PF) 100 MCG/2ML IJ SOLN
100.0000 ug | INTRAMUSCULAR | Status: DC | PRN
  Administered 2022-04-27 (×2): 100 ug via INTRAVENOUS
  Filled 2022-04-27 (×2): qty 2

## 2022-04-27 MED ORDER — PRENATAL MULTIVITAMIN CH
1.0000 | ORAL_TABLET | Freq: Every day | ORAL | Status: DC
  Administered 2022-04-28 – 2022-04-29 (×2): 1 via ORAL
  Filled 2022-04-27 (×2): qty 1

## 2022-04-27 MED ORDER — FAMOTIDINE IN NACL 20-0.9 MG/50ML-% IV SOLN
20.0000 mg | Freq: Two times a day (BID) | INTRAVENOUS | Status: DC
  Administered 2022-04-27: 20 mg via INTRAVENOUS
  Filled 2022-04-27 (×2): qty 50

## 2022-04-27 MED ORDER — FENTANYL-BUPIVACAINE-NACL 0.5-0.125-0.9 MG/250ML-% EP SOLN
12.0000 mL/h | EPIDURAL | Status: DC | PRN
  Administered 2022-04-27: 12 mL/h via EPIDURAL
  Filled 2022-04-27: qty 250

## 2022-04-27 MED ORDER — LACTATED RINGERS IV SOLN: 500.0000 mL | INTRAVENOUS | Status: DC | PRN

## 2022-04-27 NOTE — Anesthesia Preprocedure Evaluation (Addendum)
Anesthesia Evaluation  Patient identified by MRN, date of birth, ID band Patient awake    Reviewed: Allergy & Precautions, NPO status , Patient's Chart, lab work & pertinent test results  Airway Mallampati: III  TM Distance: >3 FB Neck ROM: Full    Dental  (+) Teeth Intact, Dental Advisory Given   Pulmonary neg pulmonary ROS   Pulmonary exam normal breath sounds clear to auscultation       Cardiovascular negative cardio ROS Normal cardiovascular exam Rhythm:Regular Rate:Normal     Neuro/Psych negative neurological ROS     GI/Hepatic negative GI ROS, Neg liver ROS,,,  Endo/Other  diabetes, Well Controlled, Gestational  Obesity   Renal/GU negative Renal ROS     Musculoskeletal negative musculoskeletal ROS (+)    Abdominal   Peds  Hematology negative hematology ROS (+) Plt 243k   Anesthesia Other Findings Day of surgery medications reviewed with the patient.  Reproductive/Obstetrics (+) Pregnancy                             Anesthesia Physical Anesthesia Plan  ASA: 2  Anesthesia Plan: Epidural   Post-op Pain Management:    Induction:   PONV Risk Score and Plan: 2 and Treatment may vary due to age or medical condition  Airway Management Planned: Natural Airway  Additional Equipment:   Intra-op Plan:   Post-operative Plan:   Informed Consent: I have reviewed the patients History and Physical, chart, labs and discussed the procedure including the risks, benefits and alternatives for the proposed anesthesia with the patient or authorized representative who has indicated his/her understanding and acceptance.     Dental advisory given  Plan Discussed with:   Anesthesia Plan Comments: (Patient identified. Risks/Benefits/Options discussed with patient including but not limited to bleeding, infection, nerve damage, paralysis, failed block, incomplete pain control, headache, blood  pressure changes, nausea, vomiting, reactions to medication both or allergic, itching and postpartum back pain. Confirmed with bedside nurse the patient's most recent platelet count. Confirmed with patient that they are not currently taking any anticoagulation, have any bleeding history or any family history of bleeding disorders. Patient expressed understanding and wished to proceed. All questions were answered. )       Anesthesia Quick Evaluation

## 2022-04-27 NOTE — H&P (Signed)
Natasha Kelly is a 21 y.o. G56P0010 female at 47w1dby 7wk u/s presenting for IOL due to GHague   Reports active fetal movement, contractions: none, vaginal bleeding: none, membranes: intact.  [facility]   Most recent u/s : 33w5dEFW 48%, AFI 1057.9UXcephalic, post placenta, previously seen marginal cord insertion.   This pregnancy complicated by: # GDMA1 # GBS bacteriuria # anemia> s/p Venofer x 2  Prenatal History/Complications:  # SAB [id number] Medical History: Past Medical History:  Diagnosis Date   Anemia    Bacterial vaginosis    Chlamydia    Gonorrhea    Trichomonas infection    UTI (urinary tract infection)     Past Surgical History: Past Surgical History:  Procedure Laterality Date   NO PAST SURGERIES      Obstetrical History: OB History     Gravida  2   Para  0   Term  0   Preterm  0   AB  1   Living  0      SAB  1   IAB  0   Ectopic  0   Multiple  0   Live Births  0           Social History: Social History   Socioeconomic History   Marital status: Single    Spouse name: Not on file   Number of children: Not on file   Years of education: Not on file   Highest education level: Not on file  Occupational History   Not on file  Tobacco Use   Smoking status: Never   Smokeless tobacco: Never  Vaping Use   Vaping Use: Never used  Substance and Sexual Activity   Alcohol use: Never   Drug use: Never   Sexual activity: Not Currently    Birth control/protection: Injection  Other Topics Concern   Not on file  Social History Narrative   Not on file   Social Determinants of Health   Financial Resource Strain: Not on file  Food Insecurity: No Food Insecurity (04/27/2022)   Hunger Vital Sign    Worried About Running Out of Food in the Last Year: Never true    Ran Out of Food in the Last Year: Never true  Transportation Needs: No Transportation Needs (04/27/2022)   PRAPARE -  TrHydrologistMedical): No    Lack of Transportation (Non-Medical): No  Physical Activity: Not on file  Stress: Not on file  Social Connections: Not on file    Family History: Family History  Problem Relation Age of Onset   Healthy Mother    Healthy Father    Asthma Sister    Cancer Maternal Grandmother    Diabetes Neg Hx    Heart disease Neg Hx    Hypertension Neg Hx    Stroke Neg Hx     Allergies: No Known Allergies  Medications Prior to Admission  Medication Sig Dispense Refill Last Dose   Accu-Chek Softclix Lancets lancets Use as instructed QID 100 each 12 04/27/2022   Blood Glucose Monitoring Suppl (ACCU-CHEK GUIDE) w/Device KIT 1 Device by Does not apply route daily. 1 kit 0 04/27/2022   ferrous sulfate 325 (65 FE) MG tablet Take 1 tablet (325 mg total) by mouth every other day. 30 tablet 0 04/26/2022   glucose blood (ACCU-CHEK GUIDE) test strip Use as instructed QID 100 each 12 04/27/2022   miconazole (MONISTAT 7)  2 % vaginal cream Place 1 Applicatorful vaginally at bedtime. 45 g 0 Past Month   Prenatal 27-1 MG TABS Take 1 tablet by mouth daily. 30 tablet 4 04/26/2022   vitamin C (ASCORBIC ACID) 250 MG tablet Take 1 tablet (250 mg total) by mouth daily. 30 tablet 0 04/26/2022   benzoyl peroxide 5 % external liquid Apply topically 2 (two) times daily. (Patient not taking: Reported on 02/15/2022) 142 g 12 Unknown    Review of Systems  Pertinent pos/neg as indicated in HPI  Blood pressure 130/80, pulse (!) 101, temperature 98.1 F (36.7 C), temperature source Oral, resp. rate 17, height _0  (1.6 m), weight 81.5 kg, last menstrual period 07/10/2021, unknown if currently breastfeeding. General appearance: alert, cooperative, and no distress Lungs: clear to auscultation bilaterally Heart: regular rate and rhythm Abdomen: gravid, soft, non-tender, EFW by Leopold's approximately 6-7lbs Extremities: tr edema  Fetal monitoring: FHR: 130-140s  bpm, variability: moderate,  Accelerations: Present,  decelerations:  Absent Uterine activity: rare, mild Dilation: Closed Effacement (%): Thick Station: -3 Exam by:: Suzanne Boron, RN Presentation: cephalic   Prenatal labs: ABO, Rh: --/--/A POS (12/14 0158) Antibody: NEG (12/14 0158) Rubella: 4.24 (06/27 1441) RPR: NON REACTIVE (10/19 2235)  HBsAg: NON REACTIVE (10/19 2235)  HIV: Non Reactive (10/19 2235)  GBS:   bacteriuria (11/08/21) 2hr GTT: 105/272/295  Prenatal Transfer Tool  Maternal Diabetes: No Genetic Screening: Normal Maternal Ultrasounds/Referrals: Normal Fetal Ultrasounds or other Referrals:  Referred to Materal Fetal Medicine  Maternal Substance Abuse:  No Significant Maternal Medications:  None Significant Maternal Lab Results: Group B Strep positive  Results for orders placed or performed during the hospital encounter of 04/27/22 (from the past 24 hour(s))  Glucose, capillary   Collection Time: 04/27/22  1:24 AM  Result Value Ref Range   Glucose-Capillary 89 70 - 99 mg/dL  CBC   Collection Time: 04/27/22  1:58 AM  Result Value Ref Range   WBC 6.2 4.0 - 10.5 K/uL   RBC 4.62 3.87 - 5.11 MIL/uL   Hemoglobin 12.8 12.0 - 15.0 g/dL   HCT 38.0 36.0 - 46.0 %   MCV 82.3 80.0 - 100.0 fL   MCH 27.7 26.0 - 34.0 pg   MCHC 33.7 30.0 - 36.0 g/dL   RDW 16.9 (H) 11.5 - 15.5 %   Platelets 243 150 - 400 K/uL   nRBC 0.0 0.0 - 0.2 %  Type and screen   Collection Time: 04/27/22  1:58 AM  Result Value Ref Range   ABO/RH(D) A POS    Antibody Screen NEG    Sample Expiration      04/30/2022,2359 Performed at [facility], [place]. [address] [place], [place] [address]      Assessment:  31w1dSIUP  G2P0010  GDMA1  Cat 1 FHR  GBS  pos (bacteriuria 11/08/21)   Plan:  Admit to L&D  IV pain meds/epidural prn active labor  Plan for cervical ripening w cytotec to start  PCN w labor/Pit/ROM for GBS ppx  Initial CBG then 2hr PP values  Anticipate vag del   Plans to  bottlefeed  Contraception: Depo  Circumcision: unsure  KMyrtis SerCNM 04/27/2022, 5:50 AM

## 2022-04-27 NOTE — Anesthesia Procedure Notes (Signed)
Epidural Patient location during procedure: OB Start time: 04/27/2022 11:02 AM End time: 04/27/2022 11:09 AM  Staffing Anesthesiologist: Collene Schlichter, MD Performed: anesthesiologist   Preanesthetic Checklist Completed: patient identified, IV checked, risks and benefits discussed, monitors and equipment checked, pre-op evaluation and timeout performed  Epidural Patient position: sitting Prep: DuraPrep Patient monitoring: blood pressure and continuous pulse ox Approach: midline Location: L3-L4 Injection technique: LOR air  Needle:  Needle type: Tuohy  Needle gauge: 17 G Needle length: 9 cm Needle insertion depth: 6 cm Catheter size: 19 Gauge Catheter at skin depth: 11 cm Test dose: negative and Other (1% Lidocaine)  Additional Notes Patient identified.  Risk benefits discussed including failed block, incomplete pain control, headache, nerve damage, paralysis, blood pressure changes, nausea, vomiting, reactions to medication both toxic or allergic, and postpartum back pain.  Patient expressed understanding and wished to proceed.  All questions were answered.  Sterile technique used throughout procedure and epidural site dressed with sterile barrier dressing. No paresthesia or other complications noted. The patient did not experience any signs of intravascular injection such as tinnitus or metallic taste in mouth nor signs of intrathecal spread such as rapid motor block. Please see nursing notes for vital signs. Reason for block:procedure for pain

## 2022-04-27 NOTE — Lactation Note (Signed)
This note was copied from a baby's chart. Lactation Consultation Note  Patient Name: Natasha Kelly ACZYS'A Date: 04/27/2022 Reason for consult: Initial assessment;Primapara;Term Age:20 hours Mom asking RN for Lactation. Wanting to pump and BF. Mom had all ready given formula. LC came to rm. Baby awake, placed to breast, baby wouldn't open mouth. Not cueing to eat, refusing to open mouth, wanting to go to sleep. Newborn feeding habits, behavior, STS, I&O, positioning, support, and cues reviewed. Mom encouraged to feed baby 8-12 times/24 hours and with feeding cues.  Mom stated she would try to pump. While LC setting up DEBP. Mom calling RN stating she is cramping really bad needing pain medication.  LC demonstrated DEBP but mom hurting rubbing her abd. LC suggested if she is hurting really bad not to pump right now that will make it hurt more. She should get her pain under control before she pumps. Mom agreed. Baby sleeping in bassinet. Mom has no support person with her tonight. Encouraged mom to rest. Call for assistance when needed.  Maternal Data Has patient been taught Hand Expression?: Yes Does the patient have breastfeeding experience prior to this delivery?: No  Feeding Mother's Current Feeding Choice: Breast Milk and Formula Nipple Type: Slow - flow  LATCH Score Latch: Too sleepy or reluctant, no latch achieved, no sucking elicited.  Audible Swallowing: None  Type of Nipple: Flat  Comfort (Breast/Nipple): Soft / non-tender  Hold (Positioning): Assistance needed to correctly position infant at breast and maintain latch.  LATCH Score: 4   Lactation Tools Discussed/Used Tools: Pump Breast pump type: Double-Electric Breast Pump Pump Education: Setup, frequency, and cleaning;Milk Storage Reason for Pumping: supplementation/stimulation Pumping frequency: q3hr  Interventions Interventions: Breast feeding basics reviewed;Adjust position;DEBP;Assisted with  latch;Support pillows;Skin to skin;Position options;Breast massage;Hand express;LC Services brochure;Breast compression  Discharge    Consult Status Consult Status: Follow-up Date: 04/28/22 Follow-up type: In-patient    Charyl Dancer 04/27/2022, 11:59 PM
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
## 2022-04-27 NOTE — Progress Notes (Signed)
Patient ID: Natasha Kelly, female   DOB: 08-Nov-2000, 21 y.o.   MRN: [id number]  S/p double dose of cytotec at 0230, SROM for clear fluid at 0500, then cytotec 50 oral at 0634; now lying across the bed breathing w ctx; PCN 1st dose at 0514  VSS, afebrile FHR 130-140s, +accels, no decels Ctx q 2-3 mins Cx was 1/50/vtx -3 per RN exam @ 0624  IUP@39 .1wks GDMA1 IOL process GBS+  -Continue to observe; plan to check cx ~1030 for progress and to determine next plan -Anticipate vag del  [name] [facility] 04/27/2022 7:43 AM

## 2022-04-27 NOTE — Discharge Summary (Signed)
Postpartum Discharge Summary   Patient Name: Natasha Kelly DOB: [birth year] MRN: [id number]  Date of admission: 04/27/2022 Delivery date:04/27/2022  Delivering provider: Clarnce Flock  Date of discharge: 04/29/2022  Admitting diagnosis: Gestational diabetes [O24.419] Intrauterine pregnancy: [redacted]w[redacted]d    Secondary diagnosis:  Principal Problem:   SVD (spontaneous vaginal delivery) Active Problems:   Anemia during pregnancy in second trimester   GBS bacteriuria   GDM (gestational diabetes mellitus)   Gestational diabetes  Additional problems: none    Discharge diagnosis: Term Pregnancy Delivered and GDM A1                                              Post partum procedures:none  Augmentation: Pitocin and Cytotec Complications: None  Hospital course: Induction of Labor With Vaginal Delivery   21y.o. yo G2P0010 at 328w1das admitted to the hospital 04/27/2022 for induction of labor.  Indication for induction: A1 DM.  Patient had an uncomplicated induction. Membrane Rupture Time/Date: 5:00 AM ,04/27/2022   Delivery Method:Vaginal, Spontaneous  Episiotomy: None  Lacerations:  2nd degree  Details of delivery can be found in separate delivery note.  Patient had a postpartum course complicated by none. Patient is discharged home 04/29/22.  Newborn Data: Birth date:04/27/2022  Birth time:6:38 PM  Gender:Female  Living status:Living  Apgars:6 ,9  Weight:3600 g   Magnesium Sulfate received: No BMZ received: No Rhophylac:N/A MMR:N/A T-DaP:Given prenatally Flu: Given prenatally Transfusion:Yes  Physical exam  Vitals:   04/28/22 0538 04/28/22 1529 04/28/22 2037 04/29/22 0525  BP: (!) 117/51 129/80 123/77 127/89  Pulse: 96 82 65 78  Resp: _0 Temp: 98 F (36.7 C) 98.2 F (36.8 C) 98.7 F (37.1 C) 97.9 F (36.6 C)  TempSrc: Axillary Oral    SpO2: 100%  100% 99%  Weight:      Height:       General: alert, cooperative, and no distress Lochia:  appropriate Uterine Fundus: firm Incision: N/A DVT Evaluation: No evidence of DVT seen on physical exam. Labs: Lab Results  Component Value Date   WBC 12.6 (H) 04/28/2022   HGB 11.5 (L) 04/28/2022   HCT 35.5 (L) 04/28/2022   MCV 82.8 04/28/2022   PLT 223 04/28/2022      Latest Ref Rng & Units 08/12/2020    8:15 AM  CMP  Glucose 70 - 99 mg/dL 97   BUN 6 - 20 mg/dL 7   Creatinine 0.44 - 1.00 mg/dL 0.68   Sodium 135 - 145 mmol/L 136   Potassium 3.5 - 5.1 mmol/L 3.9   Chloride 98 - 111 mmol/L 106   CO2 22 - 32 mmol/L 25   Calcium 8.9 - 10.3 mg/dL 9.5    Edinburgh Score:    04/27/2022    9:00 PM  Edinburgh Postnatal Depression Scale Screening Tool  I have been able to laugh and see the funny side of things. 0  I have looked forward with enjoyment to things. 0  I have blamed myself unnecessarily when things went wrong. 1  I have been anxious or worried for no good reason. 0  I have felt scared or panicky for no good reason. 0  Things have been getting on top of me. 0  I have been so unhappy that I have had difficulty sleeping. 0  I have felt sad or miserable. 0  I have been so unhappy that I have been crying. 0  The thought of harming myself has occurred to me. 0  Edinburgh Postnatal Depression Scale Total 1     After visit meds:  Allergies as of 04/29/2022   No Known Allergies      Medication List     STOP taking these medications    Accu-Chek Guide test strip Generic drug: glucose blood   Accu-Chek Guide w/Device Kit   Accu-Chek Softclix Lancets lancets       TAKE these medications    ferrous sulfate 325 (65 FE) MG tablet Take 1 tablet (325 mg total) by mouth every other day.   ibuprofen 600 MG tablet Commonly known as: ADVIL Take 1 tablet (600 mg total) by mouth every 6 (six) hours.   Prenatal 27-1 MG Tabs Take 1 tablet by mouth daily.   vitamin C 250 MG tablet Commonly known as: ASCORBIC ACID Take 1 tablet (250 mg total) by mouth daily.          Discharge home in stable condition Infant Feeding: Bottle Infant Disposition:home with mother Discharge instruction: per After Visit Summary and Postpartum booklet. Activity: Advance as tolerated. Pelvic rest for 6 weeks.  Diet: routine diet Future Appointments: Future Appointments  Date Time Provider [place]  05/29/2022 11:15 AM Caren Macadam, MD [facility]   Follow up Visit: - as scheduled.  Liliane Channel MD MPH OB Fellow, [place] [facility] [place] 04/29/2022

## 2022-04-28 ENCOUNTER — Encounter: Payer: Medicaid Other | Admitting: Family Medicine

## 2022-04-28 LAB — CBC
HCT: 35.5 % — ABNORMAL LOW (ref 36.0–46.0)
Hemoglobin: 11.5 g/dL — ABNORMAL LOW (ref 12.0–15.0)
MCH: 26.8 pg (ref 26.0–34.0)
MCHC: 32.4 g/dL (ref 30.0–36.0)
MCV: 82.8 fL (ref 80.0–100.0)
Platelets: 223 10*3/uL (ref 150–400)
RBC: 4.29 MIL/uL (ref 3.87–5.11)
RDW: 16.7 % — ABNORMAL HIGH (ref 11.5–15.5)
WBC: 12.6 10*3/uL — ABNORMAL HIGH (ref 4.0–10.5)
nRBC: 0 % (ref 0.0–0.2)

## 2022-04-28 NOTE — Anesthesia Postprocedure Evaluation (Signed)
Anesthesia Post Note  Patient: Lurlean Leyden  Procedure(s) Performed: AN AD HOC LABOR EPIDURAL     Patient location during evaluation: Mother Baby Anesthesia Type: Epidural Level of consciousness: awake, oriented and awake and alert Pain management: pain level controlled Vital Signs Assessment: post-procedure vital signs reviewed and stable Respiratory status: spontaneous breathing, respiratory function stable and nonlabored ventilation Cardiovascular status: stable Postop Assessment: no headache, adequate PO intake, able to ambulate, patient able to bend at knees and no apparent nausea or vomiting Anesthetic complications: no   No notable events documented.  Last Vitals:  Vitals:   04/28/22 0210 04/28/22 0538  BP: 120/74 (!) 117/51  Pulse: 90 96  Resp: 18 16  Temp: 36.7 C 36.7 C  SpO2: 99% 100%    Last Pain:  Vitals:   04/28/22 1006  TempSrc:   PainSc: 5    Pain Goal:                Epidural/Spinal Function Cutaneous sensation: Normal sensation (04/28/22 1006)  Quinlee Sciarra
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
## 2022-04-28 NOTE — Plan of Care (Signed)
  Problem: Education: Goal: Knowledge of General Education information will improve Description: Including pain rating scale, medication(s)/side effects and non-pharmacologic comfort measures Outcome: Progressing   Problem: Health Behavior/Discharge Planning: Goal: Ability to manage health-related needs will improve Outcome: Progressing   Problem: Clinical Measurements: Goal: Ability to maintain clinical measurements within normal limits will improve Outcome: Progressing Goal: Will remain free from infection Outcome: Progressing   Problem: Activity: Goal: Risk for activity intolerance will decrease Outcome: Progressing   Problem: Nutrition: Goal: Adequate nutrition will be maintained Outcome: Progressing   Problem: Coping: Goal: Level of anxiety will decrease Outcome: Progressing   Problem: Elimination: Goal: Will not experience complications related to bowel motility Outcome: Progressing   Problem: Pain Managment: Goal: General experience of comfort will improve Outcome: Progressing   Problem: Safety: Goal: Ability to remain free from injury will improve Outcome: Progressing   Problem: Skin Integrity: Goal: Risk for impaired skin integrity will decrease Outcome: Progressing   Problem: Education: Goal: Knowledge of Childbirth will improve Outcome: Progressing Goal: Ability to make informed decisions regarding treatment and plan of care will improve Outcome: Progressing Goal: Ability to state and carry out methods to decrease the pain will improve Outcome: Progressing Goal: Individualized Educational Video(s) Outcome: Progressing   Problem: Coping: Goal: Ability to verbalize concerns and feelings about labor and delivery will improve Outcome: Progressing   Problem: Life Cycle: Goal: Ability to make normal progression through stages of labor will improve Outcome: Progressing Goal: Ability to effectively push during vaginal delivery will improve Outcome:  Progressing   Problem: Role Relationship: Goal: Will demonstrate positive interactions with the child Outcome: Progressing   Problem: Safety: Goal: Risk of complications during labor and delivery will decrease Outcome: Progressing   Problem: Pain Management: Goal: Relief or control of pain from uterine contractions will improve Outcome: Progressing   Problem: Coping: Goal: Ability to adjust to condition or change in health will improve Outcome: Progressing   Problem: Health Behavior/Discharge Planning: Goal: Ability to manage health-related needs will improve Outcome: Progressing   Problem: Metabolic: Goal: Ability to maintain appropriate glucose levels will improve Outcome: Progressing   Problem: Nutritional: Goal: Maintenance of adequate nutrition will improve Outcome: Progressing Goal: Progress toward achieving an optimal weight will improve Outcome: Progressing   Problem: Tissue Perfusion: Goal: Adequacy of tissue perfusion will improve Outcome: Progressing   Problem: Education: Goal: Knowledge of condition will improve Outcome: Progressing Goal: Individualized Educational Video(s) Outcome: Progressing Goal: Individualized Newborn Educational Video(s) Outcome: Progressing   Problem: Activity: Goal: Will verbalize the importance of balancing activity with adequate rest periods Outcome: Progressing Goal: Ability to tolerate increased activity will improve Outcome: Progressing   Problem: Coping: Goal: Ability to identify and utilize available resources and services will improve Outcome: Progressing   Problem: Life Cycle: Goal: Chance of risk for complications during the postpartum period will decrease Outcome: Progressing   Problem: Role Relationship: Goal: Ability to demonstrate positive interaction with newborn will improve Outcome: Progressing   Problem: Skin Integrity: Goal: Demonstration of wound healing without infection will improve Outcome:  Progressing

## 2022-04-28 NOTE — Progress Notes (Signed)
POSTPARTUM PROGRESS NOTE  Post Partum Day 1  Subjective:  Natasha Kelly is a 21 y.o. G2P0010 s/p VD at [redacted]w[redacted]d.  She reports she is doing well. No acute events overnight. She denies any problems with ambulating, voiding or po intake. Denies nausea or vomiting.  Pain is well controlled.  Lochia is heavy.  Objective: Blood pressure (!) 117/51, pulse 96, temperature 98 F (36.7 C), temperature source Axillary, resp. rate 16, height 5\' 3"  (1.6 m), weight 81.5 kg, last menstrual period 07/10/2021, SpO2 100 %, unknown if currently breastfeeding.  Physical Exam:  General: alert, cooperative and no distress Chest: no respiratory distress Heart:regular rate, distal pulses intact Abdomen: soft, nontender,  Uterine Fundus: firm, appropriately tender DVT Evaluation: No calf swelling or tenderness Extremities: No LE edema Skin: warm, dry  Recent Labs    04/27/22 0158 04/28/22 0315  HGB 12.8 11.5*  HCT 38.0 35.5*    Assessment/Plan: Renell Coaxum is a 21 y.o. G2P0010 s/p VD at [redacted]w[redacted]d   PPD#1 - Doing well  Routine postpartum care A2GDM- CBG 73 Contraception: Depo Feeding: Bottle Dispo: Plan for discharge 12/16.   LOS: 1 day   1/17, DO OB Fellow, Faculty Practice [facility], Center for [facility] 04/28/2022, 10:31 AM

## 2022-04-28 NOTE — Plan of Care (Signed)
  Problem: Education: Goal: Knowledge of General Education information will improve Description: Including pain rating scale, medication(s)/side effects and non-pharmacologic comfort measures Outcome: Progressing   Problem: Health Behavior/Discharge Planning: Goal: Ability to manage health-related needs will improve Outcome: Progressing   Problem: Clinical Measurements: Goal: Ability to maintain clinical measurements within normal limits will improve Outcome: Progressing Goal: Will remain free from infection Outcome: Progressing Goal: Diagnostic test results will improve Outcome: Progressing Goal: Respiratory complications will improve Outcome: Progressing Goal: Cardiovascular complication will be avoided Outcome: Progressing   Problem: Activity: Goal: Risk for activity intolerance will decrease Outcome: Progressing   Problem: Nutrition: Goal: Adequate nutrition will be maintained Outcome: Progressing   Problem: Coping: Goal: Level of anxiety will decrease Outcome: Progressing   Problem: Elimination: Goal: Will not experience complications related to bowel motility Outcome: Progressing Goal: Will not experience complications related to urinary retention Outcome: Progressing   Problem: Pain Managment: Goal: General experience of comfort will improve Outcome: Progressing   Problem: Safety: Goal: Ability to remain free from injury will improve Outcome: Progressing   Problem: Skin Integrity: Goal: Risk for impaired skin integrity will decrease Outcome: Progressing   Problem: Education: Goal: Knowledge of Childbirth will improve Outcome: Progressing Goal: Ability to make informed decisions regarding treatment and plan of care will improve Outcome: Progressing Goal: Ability to state and carry out methods to decrease the pain will improve Outcome: Progressing Goal: Individualized Educational Video(s) Outcome: Progressing   Problem: Coping: Goal: Ability to  verbalize concerns and feelings about labor and delivery will improve Outcome: Progressing   Problem: Life Cycle: Goal: Ability to make normal progression through stages of labor will improve Outcome: Progressing Goal: Ability to effectively push during vaginal delivery will improve Outcome: Progressing   Problem: Role Relationship: Goal: Will demonstrate positive interactions with the child Outcome: Progressing   Problem: Safety: Goal: Risk of complications during labor and delivery will decrease Outcome: Progressing   Problem: Pain Management: Goal: Relief or control of pain from uterine contractions will improve Outcome: Progressing   Problem: Coping: Goal: Ability to adjust to condition or change in health will improve Outcome: Progressing   Problem: Health Behavior/Discharge Planning: Goal: Ability to manage health-related needs will improve Outcome: Progressing   Problem: Metabolic: Goal: Ability to maintain appropriate glucose levels will improve Outcome: Progressing   Problem: Nutritional: Goal: Maintenance of adequate nutrition will improve Outcome: Progressing Goal: Progress toward achieving an optimal weight will improve Outcome: Progressing   Problem: Tissue Perfusion: Goal: Adequacy of tissue perfusion will improve Outcome: Progressing   Problem: Education: Goal: Knowledge of condition will improve Outcome: Progressing Goal: Individualized Educational Video(s) Outcome: Progressing Goal: Individualized Newborn Educational Video(s) Outcome: Progressing   Problem: Activity: Goal: Will verbalize the importance of balancing activity with adequate rest periods Outcome: Progressing Goal: Ability to tolerate increased activity will improve Outcome: Progressing   Problem: Coping: Goal: Ability to identify and utilize available resources and services will improve Outcome: Progressing   Problem: Life Cycle: Goal: Chance of risk for complications during  the postpartum period will decrease Outcome: Progressing   Problem: Role Relationship: Goal: Ability to demonstrate positive interaction with newborn will improve Outcome: Progressing   Problem: Skin Integrity: Goal: Demonstration of wound healing without infection will improve Outcome: Progressing

## 2022-04-29 MED ORDER — IBUPROFEN 600 MG PO TABS: 600.0000 mg | ORAL_TABLET | Freq: Four times a day (QID) | ORAL | 0 refills | Status: DC

## 2022-04-29 NOTE — Lactation Note (Signed)
This note was copied from a baby's chart. Lactation Consultation Note  Patient Name: Natasha Kelly VFIEP'P Date: 04/29/2022 Reason for consult: Initial assessment;Mother's request;Difficult latch;Primapara;1st time breastfeeding;Term;Breastfeeding assistance;Infant weight loss (0.39% WL) Age:20 hours  LC entered the room and the infant was being held by the birth parent. Per the birth parent, the infant has not been latching at the breast.  She stated that she pumped once this morning, but did not see anything.  LC encouraged her to pump every 3 hours.  LC spoke with the parent about supply and demand.  LC educated the birth parent on engorgement, breast care, warning signs, infant I/O, and mastitis.  All questions were answered.   Infant Feeding Plan:  Breastfeed 8+ times in 24 hours according to feeding cues.  Attempt to put the infant to the breast prior to supplementing.  Supplement according to supplementation guidelines.  Pump every 3 hours.  Watch infant output and call the pediatrician with questions or concerns.  Call outpatient [facility] for assistance with breastfeeding.                Lactation Tools Discussed/Used Pumping frequency: q3hrs  Interventions Interventions: Breast feeding basics reviewed;Education  Discharge Discharge Education: Engorgement and breast care;Warning signs for feeding baby;Outpatient recommendation  Consult Status Consult Status: Complete Date: 04/29/22 Follow-up type: Call as needed    Delene Loll 04/29/2022, 3:52 PM

## 2022-04-29 NOTE — Discharge Instructions (Signed)
-   Continue your prenatal vitamins especially if breastfeeding - Try to eat iron rich food. - Take iron supplement as prescribed every other day. Take along with fruit or orange juice, avoid taking within 30 mins of eating dairy (milk, cheese) - Take over the counter tylenol (500mg ) or ibuprofen (200mg ) three times a day as needed for cramping/pain. - follow up in the office in 4-6 weeks as scheduled for your regular post partum visit. - Please come back to [facility] if you notice persistently elevated blood pressures or you start to have a headache, that doesn't get better with medications (tylenol and ibuprofen), rest (4hrs of sleep) and drinking water.

## 2022-05-03 ENCOUNTER — Encounter: Payer: Medicaid Other | Admitting: Family Medicine

## 2022-05-09 ENCOUNTER — Telehealth (HOSPITAL_COMMUNITY): Payer: Self-pay | Admitting: *Deleted

## 2022-05-09 NOTE — Telephone Encounter (Signed)
Attempted Hospital Discharge Follow-Up Call.  No answer on patient phone.  Unable to leave a message.  

## 2022-05-10 ENCOUNTER — Ambulatory Visit: Payer: Medicaid Other

## 2022-05-26 ENCOUNTER — Encounter: Payer: Self-pay | Admitting: Family Medicine

## 2022-05-26 NOTE — Progress Notes (Unsigned)
[facility] Partum Visit Note  Natasha Kelly is a 22 y.o. G71P0010 female who presents for a postpartum visit. She is 4 weeks postpartum following a normal spontaneous vaginal delivery.  I have fully reviewed the prenatal and intrapartum course. The delivery was at 39.1 gestational weeks.  Anesthesia: epidural. Postpartum course has been uncomplicated. Baby is doing well. Baby is feeding by breast. Bleeding no bleeding. Bowel function is normal. Bladder function is normal. Patient is not sexually active. Contraception method is Depo-Provera injections. Postpartum depression screening: negative.  The pregnancy intention screening data noted above was reviewed. Potential methods of contraception were discussed. The patient elected to proceed with No data recorded.   Edinburgh Postnatal Depression Scale - 05/29/22 1531       Edinburgh Postnatal Depression Scale:  In the Past 7 Days   I have been able to laugh and see the funny side of things. 0    I have looked forward with enjoyment to things. 0    I have blamed myself unnecessarily when things went wrong. 0    I have been anxious or worried for no good reason. 3    I have felt scared or panicky for no good reason. 3    Things have been getting on top of me. 0    I have been so unhappy that I have had difficulty sleeping. 0    I have felt sad or miserable. 0    I have been so unhappy that I have been crying. 0    The thought of harming myself has occurred to me. 0    Edinburgh Postnatal Depression Scale Total 6             Health Maintenance Due  Topic Date Due   COVID-19 Vaccine (1) Never done   HPV VACCINES (1 - 2-dose series) Never done    The following portions of the patient's history were reviewed and updated as appropriate: allergies, current medications, past family history, past medical history, past social history, past surgical history, and problem list.  Review of Systems Pertinent items are noted in HPI.  Objective:   BP 121/87   Pulse 79   Ht 5\' 3"  (1.6 m)   Wt 154 lb (69.9 kg)   LMP  (LMP Unknown)   Breastfeeding Yes   BMI 27.28 kg/m    General:  alert, cooperative, and appears stated age   Breasts:  normal  Lungs: clear to auscultation bilaterally  Heart:  regular rate and rhythm, S1, S2 normal, no murmur, click, rub or gallop  Abdomen: soft, non-tender; bowel sounds normal; no masses,  no organomegaly   Wound NA  GU exam:  not indicated       Assessment:    Normal postpartum exam.   Plan:   Essential components of care per ACOG recommendations:  1.  Mood and well being: Patient with {gen negative/positive:315881} depression screening today. Reviewed local resources for support.  - Patient tobacco use? {tobacco use:25506}  - hx of drug use? {yes/no:25505}    2. Infant care and feeding:  -Patient currently breastmilk feeding? Yes. Reviewed importance of draining breast regularly to support lactation.  -Social determinants of health (SDOH) reviewed in EPIC. No concerns  3. Sexuality, contraception and birth spacing - Patient does not want a pregnancy in the next year.  Desired family size is {NUMBER 1-10:22536} children.  - Reviewed reproductive life planning. Reviewed contraceptive methods based on pt preferences and effectiveness.  Patient desired {Upstream End  Methods:24109} today.   - Discussed birth spacing of 18 months  4. Sleep and fatigue -Encouraged family/partner/community support of 4 hrs of uninterrupted sleep to help with mood and fatigue  5. Physical Recovery  - Discussed patients delivery and complications. She describes her labor as {description:25511} - Patient had a {CHL AMB [phone]}. Patient had a {laceration:25518} laceration. Perineal healing reviewed. Patient expressed understanding - Patient has urinary incontinence? {yes/no:25515} - Patient {ACTION; IS/IS YBO:[id number]} safe to resume physical and sexual activity  6.  Health Maintenance - HM  due items addressed {Yes or If no, why not?:20788} - Last pap smear No results found for: "DIAGPAP" Pap smear {done:10129} at today's visit.  -Breast Cancer screening indicated? {indicated:25516}  7. Chronic Disease/Pregnancy Condition follow up: {Follow up:25499}  - PCP follow up  Future Appointments  Date Time Provider [place]  06/30/2022  9:30 AM [facility] for [place]

## 2022-05-29 ENCOUNTER — Encounter: Payer: Self-pay | Admitting: Family Medicine

## 2022-05-29 ENCOUNTER — Ambulatory Visit (INDEPENDENT_AMBULATORY_CARE_PROVIDER_SITE_OTHER): Payer: Medicaid Other | Admitting: Family Medicine

## 2022-05-29 ENCOUNTER — Ambulatory Visit: Payer: Medicaid Other | Admitting: Family Medicine

## 2022-05-29 DIAGNOSIS — Z30013 Encounter for initial prescription of injectable contraceptive: Secondary | ICD-10-CM | POA: Diagnosis not present

## 2022-05-29 DIAGNOSIS — O24410 Gestational diabetes mellitus in pregnancy, diet controlled: Secondary | ICD-10-CM

## 2022-05-29 DIAGNOSIS — Z9189 Other specified personal risk factors, not elsewhere classified: Secondary | ICD-10-CM

## 2022-05-29 DIAGNOSIS — Z789 Other specified health status: Secondary | ICD-10-CM | POA: Diagnosis not present

## 2022-05-29 MED ORDER — MEDROXYPROGESTERONE ACETATE 150 MG/ML IM SUSP
150.0000 mg | INTRAMUSCULAR | Status: AC
  Administered 2022-05-29: 150 mg via INTRAMUSCULAR

## 2022-05-30 DIAGNOSIS — Z9189 Other specified personal risk factors, not elsewhere classified: Secondary | ICD-10-CM | POA: Insufficient documentation

## 2022-06-30 ENCOUNTER — Other Ambulatory Visit: Payer: Medicaid Other

## 2022-06-30 DIAGNOSIS — O24410 Gestational diabetes mellitus in pregnancy, diet controlled: Secondary | ICD-10-CM

## 2022-06-30 NOTE — Addendum Note (Signed)
Addended by: Annabell Howells on: 06/30/2022 09:07 AM   Modules accepted: Orders

## 2022-07-01 LAB — GLUCOSE TOLERANCE, 2 HOURS
Glucose, 2 hour: 62 mg/dL — ABNORMAL LOW (ref 70–139)
Glucose, GTT - Fasting: 79 mg/dL (ref 70–99)

## 2022-07-04 ENCOUNTER — Encounter: Payer: Self-pay | Admitting: Family Medicine

## 2022-07-13 ENCOUNTER — Other Ambulatory Visit (HOSPITAL_COMMUNITY)
Admission: RE | Admit: 2022-07-13 | Discharge: 2022-07-13 | Disposition: A | Discharge status: Home / Self Care | Payer: Medicaid Other | Source: Ambulatory Visit | Attending: Family Medicine | Admitting: Family Medicine

## 2022-07-13 ENCOUNTER — Encounter: Payer: Self-pay | Admitting: Family Medicine

## 2022-07-13 ENCOUNTER — Other Ambulatory Visit: Payer: Self-pay

## 2022-07-13 ENCOUNTER — Ambulatory Visit (INDEPENDENT_AMBULATORY_CARE_PROVIDER_SITE_OTHER): Payer: Medicaid Other | Admitting: Family Medicine

## 2022-07-13 VITALS — BP 125/78 | HR 100 | Ht 63.0 in | Wt 153.7 lb

## 2022-07-13 DIAGNOSIS — N898 Other specified noninflammatory disorders of vagina: Secondary | ICD-10-CM

## 2022-07-13 DIAGNOSIS — Z30017 Encounter for initial prescription of implantable subdermal contraceptive: Secondary | ICD-10-CM | POA: Diagnosis not present

## 2022-07-13 DIAGNOSIS — Z3200 Encounter for pregnancy test, result unknown: Secondary | ICD-10-CM

## 2022-07-13 DIAGNOSIS — Z975 Presence of (intrauterine) contraceptive device: Secondary | ICD-10-CM | POA: Insufficient documentation

## 2022-07-13 LAB — POCT PREGNANCY, URINE: Preg Test, Ur: NEGATIVE

## 2022-07-13 MED ORDER — ETONOGESTREL 68 MG ~~LOC~~ IMPL
68.0000 mg | DRUG_IMPLANT | Freq: Once | SUBCUTANEOUS | Status: AC
  Administered 2022-07-13: 68 mg via SUBCUTANEOUS

## 2022-07-13 NOTE — Progress Notes (Signed)
     GYNECOLOGY OFFICE PROCEDURE NOTE  Natasha Kelly is a 22 y.o. G2P0010 here for Nexplanon insertion.   Nexplanon insertion Procedure Patient identified, informed consent performed, consent signed.   Patient does understand that irregular bleeding is a very common side effect of this medication. She was advised to have backup contraception for one week after placement. Pregnancy test in clinic today was negative.  Appropriate time out taken.  Patient's left arm was prepped and draped in the usual sterile fashion. The ruler used to measure and mark insertion area.  Patient was prepped with alcohol swab and then injected with 3 ml of 1% lidocaine.  She was prepped with betadine, Nexplanon removed from packaging,  Device confirmed in needle, then inserted full length of needle and withdrawn per handbook instructions. Nexplanon was able to palpated in the patient's arm; patient palpated the insert herself. There was minimal blood loss.  Patient insertion site covered with guaze and a pressure bandage to reduce any bruising.  The patient tolerated the procedure well and was given post procedure instructions.  Clarnce Flock, MD/MPH Family Medicine, [facility] for [employer], [place]

## 2022-07-13 NOTE — Progress Notes (Signed)
   MOM+BABY COMBINED CARE GYNECOLOGY OFFICE VISIT NOTE  History:   Natasha Kelly is a 22 y.o. G2P0010 here today for positive home UPT.  Reports one positive and multiple negatives Last had depo 05/29/2022 Feels like she is gaining weight and does not trust the method Would like nexplanon instead  Would like swab Had unprotected sex  Health Maintenance Due  Topic Date Due   COVID-19 Vaccine (1) Never done   HPV VACCINES (1 - 2-dose series) Never done    Past Medical History:  Diagnosis Date   Anemia    Bacterial vaginosis    Chlamydia    Gonorrhea    Trichomonas infection    UTI (urinary tract infection)     Past Surgical History:  Procedure Laterality Date   NO PAST SURGERIES      The following portions of the patient's history were reviewed and updated as appropriate: allergies, current medications, past family history, past medical history, past social history, past surgical history and problem list.   Health Maintenance:   Last pap: No results found for: "DIAGPAP", "HPV", "[place]" N/a  Last mammogram:  N/a    Review of Systems:  Pertinent items noted in HPI and remainder of comprehensive ROS otherwise negative.  Physical Exam:  BP 125/78   Pulse 100   Ht 5' 3"$  (1.6 m)   Wt 153 lb 11.2 oz (69.7 kg)   LMP  (LMP Unknown)   Breastfeeding Yes   BMI 27.23 kg/m  CONSTITUTIONAL: Well-developed, well-nourished female in no acute distress.  HEENT:  Normocephalic, atraumatic. External right and left ear normal. No scleral icterus.  NECK: Normal range of motion, supple, no masses noted on observation SKIN: No rash noted. Not diaphoretic. No erythema. No pallor. MUSCULOSKELETAL: Normal range of motion. No edema noted. NEUROLOGIC: Alert and oriented to person, place, and time. Normal muscle tone coordination.  PSYCHIATRIC: Normal mood and affect. Normal behavior. Normal judgment and thought content. RESPIRATORY: Effort normal, no problems with respiration  noted   Labs and Imaging Results for orders placed or performed in visit on 07/13/22 (from the past 168 hour(s))  Pregnancy, urine POC   Collection Time: 07/13/22  4:05 PM  Result Value Ref Range   Preg Test, Ur NEGATIVE NEGATIVE   No results found.    Assessment and Plan:   Problem List Items Addressed This Visit       Other   Nexplanon in place    Uncomplicated insertion, see procedure note      Other Visit Diagnoses     Possible pregnancy    -  Primary   Relevant Orders   Beta hCG quant (ref lab)   Vaginal discharge       Relevant Orders   Cervicovaginal ancillary only( [place])   Nexplanon insertion           Routine preventative health maintenance measures emphasized. Please refer to After Visit Summary for other counseling recommendations.   Return for as needed.    Total face-to-face time with patient: 15 minutes.  Over 50% of encounter was spent on counseling and coordination of care.   Clarnce Flock, MD/MPH Attending Family Medicine Physician, [facility] for [facility], [place]

## 2022-07-13 NOTE — Assessment & Plan Note (Signed)
Uncomplicated insertion, see procedure note

## 2022-07-14 ENCOUNTER — Encounter: Payer: Self-pay | Admitting: Family Medicine

## 2022-07-14 LAB — CERVICOVAGINAL ANCILLARY ONLY
Bacterial Vaginitis (gardnerella): POSITIVE — AB
Candida Glabrata: NEGATIVE
Candida Vaginitis: NEGATIVE
Chlamydia: NEGATIVE
Comment: NEGATIVE
Comment: NEGATIVE
Comment: NEGATIVE
Comment: NEGATIVE
Comment: NEGATIVE
Comment: NORMAL
Neisseria Gonorrhea: NEGATIVE
Trichomonas: NEGATIVE

## 2022-07-14 LAB — BETA HCG QUANT (REF LAB): hCG Quant: <1 m[IU]/mL

## 2022-07-14 MED ORDER — METRONIDAZOLE 0.75 % VA GEL: 1.0000 | Freq: Every day | VAGINAL | 0 refills | Status: AC

## 2022-08-04 ENCOUNTER — Encounter (HOSPITAL_COMMUNITY): Payer: Self-pay

## 2022-08-04 ENCOUNTER — Emergency Department (HOSPITAL_COMMUNITY)
Admission: EM | Admit: 2022-08-04 | Discharge: 2022-08-04 | Disposition: A | Discharge status: Home / Self Care | Payer: Medicaid Other | Attending: Emergency Medicine | Admitting: Emergency Medicine

## 2022-08-04 DIAGNOSIS — B3731 Acute candidiasis of vulva and vagina: Secondary | ICD-10-CM | POA: Diagnosis not present

## 2022-08-04 DIAGNOSIS — N938 Other specified abnormal uterine and vaginal bleeding: Secondary | ICD-10-CM | POA: Diagnosis present

## 2022-08-04 DIAGNOSIS — B379 Candidiasis, unspecified: Secondary | ICD-10-CM

## 2022-08-04 LAB — URINALYSIS, ROUTINE W REFLEX MICROSCOPIC
Bilirubin Urine: NEGATIVE
Glucose, UA: NEGATIVE mg/dL
Hgb urine dipstick: NEGATIVE
Ketones, ur: NEGATIVE mg/dL
Nitrite: NEGATIVE
Protein, ur: NEGATIVE mg/dL
Specific Gravity, Urine: 1.025 (ref 1.005–1.030)
pH: 6 (ref 5.0–8.0)

## 2022-08-04 LAB — PREGNANCY, URINE: Preg Test, Ur: NEGATIVE

## 2022-08-04 LAB — WET PREP, GENITAL
Clue Cells Wet Prep HPF POC: NONE SEEN
Sperm: NONE SEEN
Trich, Wet Prep: NONE SEEN
WBC, Wet Prep HPF POC: <10 (ref <10)

## 2022-08-04 MED ORDER — FLUCONAZOLE 150 MG PO TABS
150.0000 mg | ORAL_TABLET | Freq: Once | ORAL | Status: AC
  Administered 2022-08-04: 150 mg via ORAL
  Filled 2022-08-04: qty 1

## 2022-08-04 MED ORDER — FLUCONAZOLE 150 MG PO TABS: 150.0000 mg | ORAL_TABLET | Freq: Once | ORAL | 0 refills | Status: DC | PRN

## 2022-08-04 NOTE — Discharge Instructions (Addendum)
You came to the emergency department today due to vaginal discharge.  You have a yeast infection.  You were treated with Diflucan and I sent a dose to your pharmacy to take in 1 week if you continue to have symptoms.  Follow-up on your STD testing online.  For any further STD concerns, please present to the health department.

## 2022-08-04 NOTE — ED Provider Notes (Signed)
[facility] Provider Note   CSN: [id number] Arrival date & time: 08/04/22  1523     History No chief complaint on file.   Natasha Kelly is a 22 y.o. female with complaint of vaginal discharge.  Reports that she has been noticing some discharge over the last couple days that is clear.  No itching, odors or known STD exposures.  No abdominal pain or cramping.  No fevers.  HPI     Home Medications Prior to Admission medications   Not on File      Allergies    Patient has no known allergies.    Review of Systems   Review of Systems  Physical Exam Updated Vital Signs BP 136/87 (BP Location: Right Arm)   Pulse 94   Temp 98.3 F (36.8 C)   Resp 17   LMP  (LMP Unknown)   SpO2 100%  Physical Exam Vitals and nursing note reviewed.  Constitutional:      Appearance: Normal appearance.  HENT:     Head: Normocephalic and atraumatic.  Eyes:     General: No scleral icterus.    Conjunctiva/sclera: Conjunctivae normal.  Pulmonary:     Effort: Pulmonary effort is normal. No respiratory distress.  Abdominal:     General: Abdomen is flat.     Palpations: Abdomen is soft.     Tenderness: There is no abdominal tenderness.  Skin:    Findings: No rash.  Neurological:     Mental Status: She is alert.  Psychiatric:        Mood and Affect: Mood normal.     ED Results / Procedures / Treatments   Labs (all labs ordered are listed, but only abnormal results are displayed) Labs Reviewed  WET PREP, GENITAL - Abnormal; Notable for the following components:      Result Value   Yeast Wet Prep HPF POC PRESENT (*)    All other components within normal limits  URINALYSIS, ROUTINE W REFLEX MICROSCOPIC - Abnormal; Notable for the following components:   APPearance HAZY (*)    Leukocytes,Ua TRACE (*)    Bacteria, UA RARE (*)    All other components within normal limits  PREGNANCY, URINE  GC/CHLAMYDIA PROBE AMP ([facility]) NOT AT [facility]     EKG None  Radiology No results found.  Procedures Procedures   Medications Ordered in ED Medications - No data to display  ED Course/ Medical Decision Making/ A&P                             Medical Decision Making Amount and/or Complexity of Data Reviewed Labs: ordered.  Risk Prescription drug management.   22 year old female presenting with vaginal discharge.  Differential includes but is not limited to yeast infection, BV, STD, PID or TOA.  Per external chart review patient was [facility] at [facility] for this 2 weeks ago.  She had BV and was prescribed Flagyl.  At that time she tested negative for chlamydia and gonorrhea.  MDM/disposition: 22 year old female presenting today with vaginal discharge.  Reports that it continues despite being prescribed Flagyl.  She has been sexually active with 1 individual since then.  Denies any thick, odorous or abnormally colored discharge.  Patient's vital signs are stable.  Low suspicion PID/TOA, especially given recent negative STD testing.  Patient was self swab in triage.  Wet prep with yeast.  Given 1 dose of Diflucan and another sent  to her pharmacy.  Patient hemodynamically stable and will be discharged at this time   Final Clinical Impression(s) / ED Diagnoses Final diagnoses:  Yeast infection    Rx / DC Orders ED Discharge Orders          Ordered    fluconazole (DIFLUCAN) 150 MG tablet  Once PRN        08/04/22 1606           Results and diagnoses were explained to the patient. Return precautions discussed in full. Patient had no additional questions and expressed complete understanding.   This chart was dictated using voice recognition software.  Despite best efforts to proofread,  errors can occur which can change the documentation meaning.    Rhae Hammock, PA-C 08/04/22 [place], [place], DO 08/06/22 0022

## 2022-08-04 NOTE — ED Triage Notes (Signed)
Pt endorses unusual vaginal discharge x 1 week; pt is sexually active; no itching/burning; discharge clear, no odor

## 2022-08-07 LAB — GC/CHLAMYDIA PROBE AMP (~~LOC~~) NOT AT ARMC
Chlamydia: NEGATIVE
Comment: NEGATIVE
Comment: NORMAL
Neisseria Gonorrhea: NEGATIVE

## 2022-08-16 ENCOUNTER — Encounter: Payer: Self-pay | Admitting: Family Medicine

## 2022-08-21 ENCOUNTER — Ambulatory Visit (INDEPENDENT_AMBULATORY_CARE_PROVIDER_SITE_OTHER): Payer: Medicaid Other | Admitting: Family Medicine

## 2022-08-21 ENCOUNTER — Encounter: Payer: Self-pay | Admitting: Family Medicine

## 2022-08-21 VITALS — BP 125/83 | HR 114 | Ht 63.0 in | Wt 170.2 lb

## 2022-08-21 DIAGNOSIS — K1370 Unspecified lesions of oral mucosa: Secondary | ICD-10-CM | POA: Diagnosis not present

## 2022-08-21 DIAGNOSIS — Z3042 Encounter for surveillance of injectable contraceptive: Secondary | ICD-10-CM | POA: Diagnosis not present

## 2022-08-21 MED ORDER — MEDROXYPROGESTERONE ACETATE 150 MG/ML IM SUSP
150.0000 mg | Freq: Once | INTRAMUSCULAR | Status: AC
  Administered 2022-08-21: 150 mg via INTRAMUSCULAR

## 2022-08-21 NOTE — Progress Notes (Signed)
   GYNECOLOGY PROBLEM  VISIT ENCOUNTER NOTE  Subjective:   Natasha Kelly is a 22 y.o. G32P1010 female here for a problem GYN visit.  Current complaints: Cyst on inner lip - present for 1 week. Reports no pain, drainage. Area is getting smaller in general. Not worsening in anyway.   Denies abnormal vaginal bleeding, discharge, pelvic pain, problems with intercourse or other gynecologic concerns.    Gynecologic History Patient's last menstrual period was 08/19/2022 (exact date).  Contraception: Depo-Provera injections  Health Maintenance Due  Topic Date Due   COVID-19 Vaccine (1) Never done   HPV VACCINES (1 - 2-dose series) Never done    The following portions of the patient's history were reviewed and updated as appropriate: allergies, current medications, past family history, past medical history, past social history, past surgical history and problem list.  Review of Systems Pertinent items are noted in HPI.   Objective:  BP 125/83   Pulse (!) 114   Ht 5\' 3"  (1.6 m)   Wt 170 lb 3.2 oz (77.2 kg)   LMP 08/19/2022 (Exact Date)   Breastfeeding No   BMI 30.15 kg/m  Gen: well appearing, NAD HEENT: no scleral icterus. 71mm nodule on inner right bottom lip. Non-tender. No fluctuance.    CV: RR Lung: Normal WOB Ext: warm well perfused  PELVIC: not indicated.    Assessment and Plan:  1. Encounter for surveillance of injectable contraceptive - Desires depo today, likes method - medroxyPROGESTERone (DEPO-PROVERA) injection 150 mg  2. Mouth lesion Likely benign since improving and present for only 1 week Discussed use of sour candy  Monitor area and if not improving ENT referral for possible bx.    Please refer to After Visit Summary for other counseling recommendations.   No follow-ups on file.  Federico Flake, MD, MPH, ABFM Attending Physician Faculty Practice- Center for [facility]

## 2022-08-28 ENCOUNTER — Encounter: Payer: Self-pay | Admitting: Family Medicine

## 2022-08-28 ENCOUNTER — Other Ambulatory Visit (HOSPITAL_COMMUNITY)
Admission: RE | Admit: 2022-08-28 | Discharge: 2022-08-28 | Disposition: A | Discharge status: Home / Self Care | Payer: Medicaid Other | Source: Ambulatory Visit | Attending: Family Medicine | Admitting: Family Medicine

## 2022-08-28 ENCOUNTER — Ambulatory Visit (INDEPENDENT_AMBULATORY_CARE_PROVIDER_SITE_OTHER): Payer: Medicaid Other | Admitting: Family Medicine

## 2022-08-28 VITALS — BP 130/74 | HR 101 | Ht 63.0 in | Wt 170.0 lb

## 2022-08-28 DIAGNOSIS — N898 Other specified noninflammatory disorders of vagina: Secondary | ICD-10-CM

## 2022-08-28 NOTE — Progress Notes (Signed)
   GYNECOLOGY PROBLEM  VISIT ENCOUNTER NOTE  Subjective:   Natasha Kelly is a 22 y.o. G49P1010 female here for a problem GYN visit.  Current complaints: vaginal odor and discharge for 2 days. Reports being treated for yeast in the end of march. No new partners or sex since treatment.   Denies abnormal vaginal bleeding, discharge, pelvic pain, problems with intercourse or other gynecologic concerns.    Gynecologic History No LMP recorded (lmp unknown). Patient has had an implant.  Contraception: Nexplanon  Health Maintenance Due  Topic Date Due   COVID-19 Vaccine (1) Never done   HPV VACCINES (1 - 2-dose series) Never done    The following portions of the patient's history were reviewed and updated as appropriate: allergies, current medications, past family history, past medical history, past social history, past surgical history and problem list.  Review of Systems Pertinent items are noted in HPI.   Objective:  BP 130/74   Pulse (!) 101   Ht 5\' 3"  (1.6 m)   Wt 170 lb (77.1 kg)   LMP  (LMP Unknown)   Breastfeeding No   BMI 30.11 kg/m  Gen: well appearing, NAD HEENT: no scleral icterus CV: RR Lung: Normal WOB Ext: warm well perfused  PELVIC: deferred, self swab   Assessment and Plan:  1. Vaginal odor Suspect BV given recent diflucan treatment  2. Vaginal discharge -Cervicoancillary swab today   Please refer to After Visit Summary for other counseling recommendations.   Return if symptoms worsen or fail to improve.  Federico Flake, MD, MPH, ABFM Attending Physician Faculty Practice- [facility]

## 2022-08-29 LAB — CERVICOVAGINAL ANCILLARY ONLY
Bacterial Vaginitis (gardnerella): NEGATIVE
Candida Glabrata: NEGATIVE
Candida Vaginitis: NEGATIVE
Chlamydia: NEGATIVE
Comment: NEGATIVE
Comment: NEGATIVE
Comment: NEGATIVE
Comment: NEGATIVE
Comment: NEGATIVE
Comment: NORMAL
Neisseria Gonorrhea: NEGATIVE
Trichomonas: NEGATIVE

## 2022-09-13 ENCOUNTER — Other Ambulatory Visit (HOSPITAL_COMMUNITY)
Admission: RE | Admit: 2022-09-13 | Discharge: 2022-09-13 | Disposition: A | Discharge status: Home / Self Care | Payer: Medicaid Other | Source: Ambulatory Visit | Attending: Obstetrics & Gynecology | Admitting: Obstetrics & Gynecology

## 2022-09-13 ENCOUNTER — Ambulatory Visit (INDEPENDENT_AMBULATORY_CARE_PROVIDER_SITE_OTHER): Payer: Medicaid Other

## 2022-09-13 VITALS — BP 127/89 | HR 76 | Ht 63.0 in | Wt 171.0 lb

## 2022-09-13 DIAGNOSIS — N898 Other specified noninflammatory disorders of vagina: Secondary | ICD-10-CM | POA: Diagnosis not present

## 2022-09-13 DIAGNOSIS — Z3202 Encounter for pregnancy test, result negative: Secondary | ICD-10-CM

## 2022-09-13 NOTE — Progress Notes (Signed)
Pt here today for self swab due to have a lot of vaginal discharge with odor.  Pt also reports that she has had sex with her partner since her last swab on 08/28/22 and wants to get  STD tested.  Pt also requests a pregnancy test as she was told she could still get pregnant while having Nexplanon inserted by a family member.  UPT negative. Pt explained how to obtain self swab.  Pt advised that we will contact her with abnormal results.  Pt verbalized understanding with no further questions.   Leonette Nutting  09/18/22

## 2022-09-14 ENCOUNTER — Other Ambulatory Visit: Payer: Self-pay | Admitting: Obstetrics & Gynecology

## 2022-09-14 ENCOUNTER — Encounter: Payer: Self-pay | Admitting: Obstetrics & Gynecology

## 2022-09-14 DIAGNOSIS — B3731 Acute candidiasis of vulva and vagina: Secondary | ICD-10-CM

## 2022-09-14 DIAGNOSIS — N760 Acute vaginitis: Secondary | ICD-10-CM

## 2022-09-14 DIAGNOSIS — B379 Candidiasis, unspecified: Secondary | ICD-10-CM

## 2022-09-14 LAB — POCT PREGNANCY, URINE: Preg Test, Ur: NEGATIVE

## 2022-09-14 LAB — CERVICOVAGINAL ANCILLARY ONLY
Bacterial Vaginitis (gardnerella): POSITIVE — AB
Candida Glabrata: POSITIVE — AB
Candida Vaginitis: NEGATIVE
Chlamydia: NEGATIVE
Comment: NEGATIVE
Comment: NEGATIVE
Comment: NEGATIVE
Comment: NEGATIVE
Comment: NEGATIVE
Comment: NORMAL
Neisseria Gonorrhea: NEGATIVE
Trichomonas: NEGATIVE

## 2022-09-14 MED ORDER — BORIC ACID CRYS
600.0000 mg | CRYSTALS | Freq: Every day | 2 refills | Status: AC
Start: 2022-09-14 — End: 2022-09-28

## 2022-09-14 MED ORDER — METRONIDAZOLE 500 MG PO TABS
500.0000 mg | ORAL_TABLET | Freq: Two times a day (BID) | ORAL | 2 refills | Status: AC
Start: 2022-09-14 — End: 2022-09-21

## 2022-09-14 MED ORDER — FLUCONAZOLE 150 MG PO TABS
150.0000 mg | ORAL_TABLET | Freq: Once | ORAL | 3 refills | Status: AC
Start: 2022-09-14 — End: 2022-09-14

## 2022-11-06 ENCOUNTER — Ambulatory Visit: Payer: Medicaid Other

## 2022-11-06 ENCOUNTER — Other Ambulatory Visit: Payer: Self-pay

## 2022-11-06 VITALS — BP 134/87 | HR 100 | Wt 174.4 lb

## 2022-11-06 DIAGNOSIS — Z975 Presence of (intrauterine) contraceptive device: Secondary | ICD-10-CM

## 2022-11-06 NOTE — Progress Notes (Signed)
Natasha Kelly was scheduled today for Depo Provera injection. Pt states she has implant for birth control but was unsure if she also needed Depo. Per chart review Nexplanon inserted 07/13/22 and patient given Depo Provera on 08/21/22. Pt now reports vaginal bleeding for past month. Describes using 3-4 pads/day. Bleeding will stop for a few hours and then return.   Reviewed with Alvester Morin MD who states pt does not need Depo Provera for contraception because Nexplanon is in place. Pt should follow up in 1 month if still bleeding. May have virtual visit to discuss if bleeding doesn't resolve.  Marjo Bicker, RN 11/06/2022  3:05 PM

## 2022-11-14 ENCOUNTER — Encounter (HOSPITAL_COMMUNITY): Payer: Self-pay

## 2022-11-14 ENCOUNTER — Emergency Department (HOSPITAL_COMMUNITY)
Admission: EM | Admit: 2022-11-14 | Discharge: 2022-11-14 | Disposition: A | Discharge status: Home / Self Care | Payer: Medicaid Other | Attending: Emergency Medicine | Admitting: Emergency Medicine

## 2022-11-14 DIAGNOSIS — Z202 Contact with and (suspected) exposure to infections with a predominantly sexual mode of transmission: Secondary | ICD-10-CM | POA: Diagnosis present

## 2022-11-14 DIAGNOSIS — N898 Other specified noninflammatory disorders of vagina: Secondary | ICD-10-CM | POA: Diagnosis not present

## 2022-11-14 DIAGNOSIS — Z711 Person with feared health complaint in whom no diagnosis is made: Secondary | ICD-10-CM | POA: Diagnosis not present

## 2022-11-14 LAB — URINALYSIS, ROUTINE W REFLEX MICROSCOPIC
Bilirubin Urine: NEGATIVE
Glucose, UA: NEGATIVE mg/dL
Hgb urine dipstick: NEGATIVE
Ketones, ur: NEGATIVE mg/dL
Nitrite: NEGATIVE
Protein, ur: 100 mg/dL — AB
Specific Gravity, Urine: 1.025 (ref 1.005–1.030)
pH: 8 (ref 5.0–8.0)

## 2022-11-14 LAB — WET PREP, GENITAL
Clue Cells Wet Prep HPF POC: NONE SEEN
Sperm: NONE SEEN
Trich, Wet Prep: NONE SEEN
WBC, Wet Prep HPF POC: >=10 — AB (ref <10)
Yeast Wet Prep HPF POC: NONE SEEN

## 2022-11-14 LAB — PREGNANCY, URINE: Preg Test, Ur: NEGATIVE

## 2022-11-14 MED ORDER — DOXYCYCLINE HYCLATE 100 MG PO TABS
100.0000 mg | ORAL_TABLET | Freq: Once | ORAL | Status: AC
  Administered 2022-11-14: 100 mg via ORAL
  Filled 2022-11-14: qty 1

## 2022-11-14 MED ORDER — DOXYCYCLINE HYCLATE 100 MG PO TABS
100.0000 mg | ORAL_TABLET | Freq: Two times a day (BID) | ORAL | 0 refills | Status: DC
Start: 2022-11-14 — End: 2023-02-20

## 2022-11-14 MED ORDER — LIDOCAINE HCL (PF) 1 % IJ SOLN
1.0000 mL | Freq: Once | INTRAMUSCULAR | Status: AC
  Administered 2022-11-14: 2 mL
  Filled 2022-11-14: qty 5

## 2022-11-14 MED ORDER — CEFTRIAXONE SODIUM 500 MG IJ SOLR
500.0000 mg | Freq: Once | INTRAMUSCULAR | Status: AC
  Administered 2022-11-14: 500 mg via INTRAMUSCULAR
  Filled 2022-11-14: qty 500

## 2022-11-14 NOTE — ED Triage Notes (Signed)
Pt to ED Requesting STD screening. Reports had unprotected intercourse 2 days ago and now having odorous vaginal discharge. Pt concerned for STD

## 2022-11-14 NOTE — ED Provider Notes (Signed)
[place] EMERGENCY DEPARTMENT AT [facility] Provider Note   CSN: [id number] Arrival date & time: 11/14/22  1607     History  Chief Complaint  Patient presents with   Exposure to STD    Natasha Kelly is a 22 y.o. female for evaluation of vaginal discharge.  Had unprotected intercourse 2 days ago and is now having vaginal discharge.  No pain.  She is concerned aboutSTD.  History of trichomonas, chlamydia, gonorrhea, BV. No labial lesions.  No dysuria, hematuria, fever, pelvic pain.  Requesting STD screen.  Denies chance of pregnancy.  HPI     Home Medications Prior to Admission medications   Medication Sig Start Date End Date Taking? Authorizing Provider  doxycycline (VIBRA-TABS) 100 MG tablet Take 1 tablet (100 mg total) by mouth 2 (two) times daily. 11/14/22  Yes Khaleah Duer A, PA-C  etonogestrel (NEXPLANON) 68 MG IMPL implant 1 each by Subdermal route once.    [provider]      Allergies    Patient has no known allergies.    Review of Systems   Review of Systems  Constitutional: Negative.   HENT: Negative.    Respiratory: Negative.    Cardiovascular: Negative.   Gastrointestinal: Negative.   Genitourinary:  Positive for vaginal discharge. Negative for decreased urine volume, difficulty urinating, dysuria, flank pain, frequency, genital sores, hematuria, menstrual problem, pelvic pain, urgency, vaginal bleeding and vaginal pain.  Musculoskeletal: Negative.   Neurological: Negative.   All other systems reviewed and are negative.   Physical Exam Updated Vital Signs BP (!) 139/94 (BP Location: Right Arm)   Pulse 95   Temp 99.1 F (37.3 C) (Oral)   Resp 18   Ht 5\' 3"  (1.6 m)   Wt 79.4 kg   SpO2 100%   BMI 31.00 kg/m  Physical Exam Vitals and nursing note reviewed.  Constitutional:      General: She is not in acute distress.    Appearance: She is well-developed. She is not ill-appearing, toxic-appearing or diaphoretic.  HENT:      Head: Atraumatic.  Eyes:     Pupils: Pupils are equal, round, and reactive to light.  Cardiovascular:     Rate and Rhythm: Normal rate.  Pulmonary:     Effort: Pulmonary effort is normal. No respiratory distress.     Breath sounds: Normal breath sounds.  Abdominal:     General: Bowel sounds are normal. There is no distension.     Palpations: Abdomen is soft.  Genitourinary:    Comments: Patient declined pelvic exam Musculoskeletal:        General: Normal range of motion.     Cervical back: Normal range of motion.  Skin:    General: Skin is warm and dry.  Neurological:     General: No focal deficit present.     Mental Status: She is alert.  Psychiatric:        Mood and Affect: Mood normal.     ED Results / Procedures / Treatments   Labs (all labs ordered are listed, but only abnormal results are displayed) Labs Reviewed  WET PREP, GENITAL - Abnormal; Notable for the following components:      Result Value   WBC, Wet Prep HPF POC >=10 (*)    All other components within normal limits  URINALYSIS, ROUTINE W REFLEX MICROSCOPIC - Abnormal; Notable for the following components:   APPearance HAZY (*)    Protein, ur 100 (*)    Leukocytes,Ua MODERATE (*)  Bacteria, UA RARE (*)    All other components within normal limits  PREGNANCY, URINE  GC/CHLAMYDIA PROBE AMP ([place]) NOT AT [facility]    EKG None  Radiology No results found.  Procedures Procedures    Medications Ordered in ED Medications  cefTRIAXone (ROCEPHIN) injection 500 mg (500 mg Intramuscular Given 11/14/22 1803)  lidocaine (PF) (XYLOCAINE) 1 % injection 1-2.1 mL (2 mLs Other Given 11/14/22 1804)  doxycycline (VIBRA-TABS) tablet 100 mg (100 mg Oral Given 11/14/22 1803)    ED Course/ Medical Decision Making/ A&P    22 year old history of STDs here for evaluation STD screening.  2 days ago had unprotected sexual intercourse.  She is now having malodorous vaginal discharge.  No pelvic pain, hematuria, denies  chance of pregnancy.  No UTI symptoms.  Abdomen soft, nontender.  Offered pelvic exam which patient declined.  She prefers to self swab which I feel is reasonable given she does not have any pain.  She is requesting empiric antibiotics for gonorrhea and chlamydia I feel is reasonable given prior history of same.  Does not want HIV, RPR testing.  Labs personally viewed and interpreted:  Pregnancy test negative UA moderate leuks, rare bacteria Wet prep many WBC   Pt presents with concerns for possible STD.  Pt understands that they have GC/Chlamydia cultures pending and that they will need to inform all sexual partners if results return positive. Pt has been treated prophylactically with Doxycycline and Rocephin due to pts history, pelvic exam, and wet prep with increased WBCs. Patient to be discharged with instructions to follow up with OBGYN/PCP. Discussed importance of using protection when sexually active.    The patient has been appropriately medically screened and/or stabilized in the ED. I have low suspicion for any other emergent medical condition which would require further screening, evaluation or treatment in the ED or require inpatient management.  Patient is hemodynamically stable and in no acute distress.  Patient able to ambulate in department prior to ED.  Evaluation does not show acute pathology that would require ongoing or additional emergent interventions while in the emergency department or further inpatient treatment.  I have discussed the diagnosis with the patient and answered all questions.  Pain is been managed while in the emergency department and patient has no further complaints prior to discharge.  Patient is comfortable with plan discussed in room and is stable for discharge at this time.  I have discussed strict return precautions for returning to the emergency department.  Patient was encouraged to follow-up with PCP/specialist refer to at discharge.                              Medical Decision Making Amount and/or Complexity of Data Reviewed External Data Reviewed: labs and notes. Labs: ordered. Decision-making details documented in ED Course.  Risk OTC drugs. Prescription drug management. Parenteral controlled substances. Diagnosis or treatment significantly limited by social determinants of health.          Final Clinical Impression(s) / ED Diagnoses Final diagnoses:  Concern about STD in female without diagnosis    Rx / DC Orders ED Discharge Orders          Ordered    doxycycline (VIBRA-TABS) 100 MG tablet  2 times daily        11/14/22 1754              Merrell Borsuk A, PA-C 11/14/22 1821  Lonell Grandchild, MD 11/16/22 1029

## 2022-11-14 NOTE — Discharge Instructions (Signed)
Pleasure taking care of you here in the emergency department  We are treating you for a possible STD.  If your symptoms do not improve after the completion of antibiotics make sure to follow-up with OB/GYN for further evaluation

## 2022-11-15 LAB — GC/CHLAMYDIA PROBE AMP (~~LOC~~) NOT AT ARMC
Chlamydia: NEGATIVE
Comment: NEGATIVE
Comment: NORMAL
Neisseria Gonorrhea: NEGATIVE

## 2022-11-17 ENCOUNTER — Ambulatory Visit: Payer: Medicaid Other

## 2022-11-17 IMAGING — US US OB < 14 WEEKS - US OB TV
1 series · 15 of 28 positions shown · non-contrast
Comparison: None.

CLINICAL DATA: Vaginal bleeding

EXAM:
OBSTETRIC <14 WK US AND TRANSVAGINAL OB US
TECHNIQUE: Both transabdominal and transvaginal ultrasound examinations were
performed for complete evaluation of the gestation as well as the
maternal uterus, adnexal regions, and pelvic cul-de-sac.
Transvaginal technique was performed to assess early pregnancy.

[Series 1: us ob < 14 weeks - us ob tv · 15 of 41 slices shown]
[im 1/41]
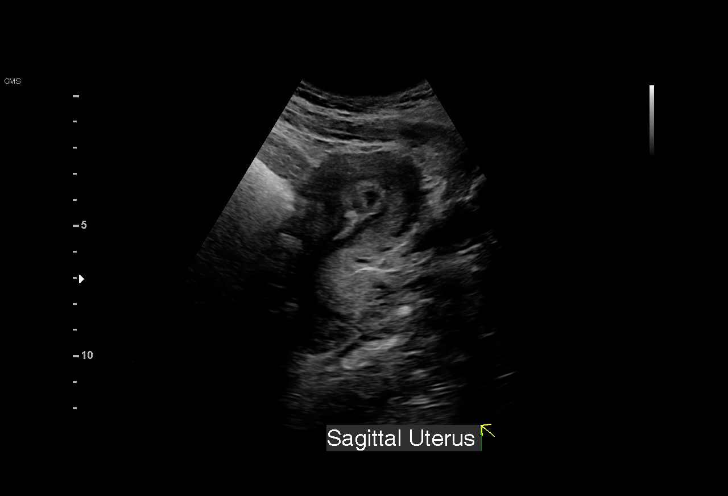
[im 3/41]
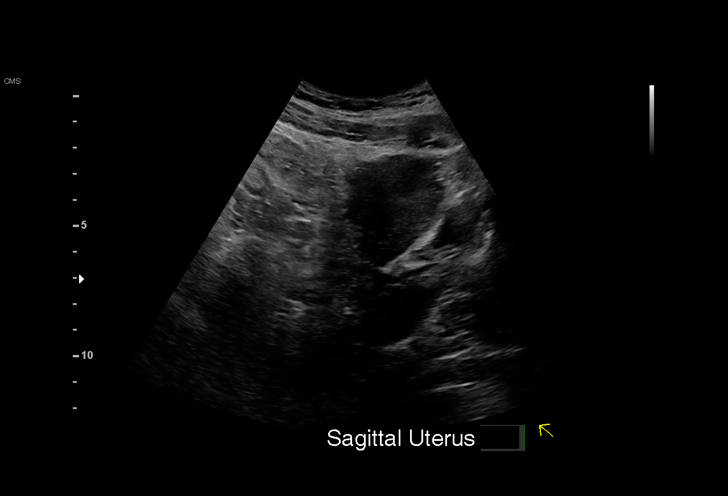
[im 6/41]
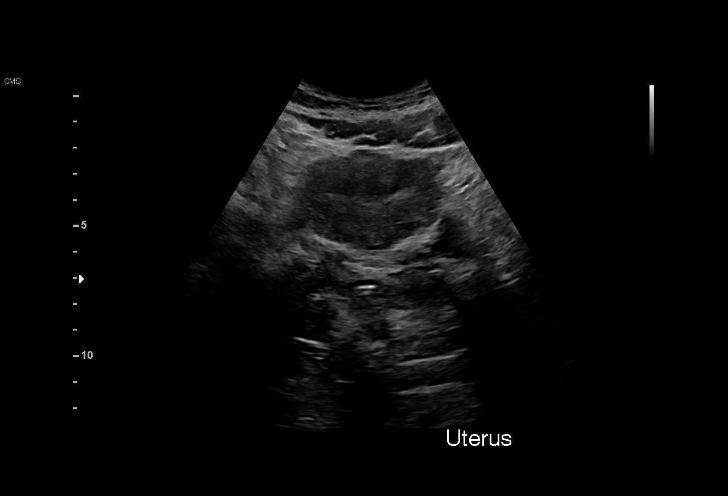
[im 9/41]
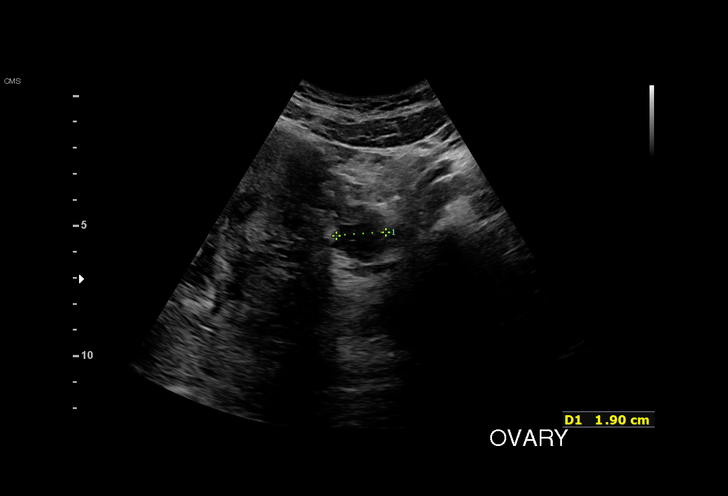
[im 12/41]
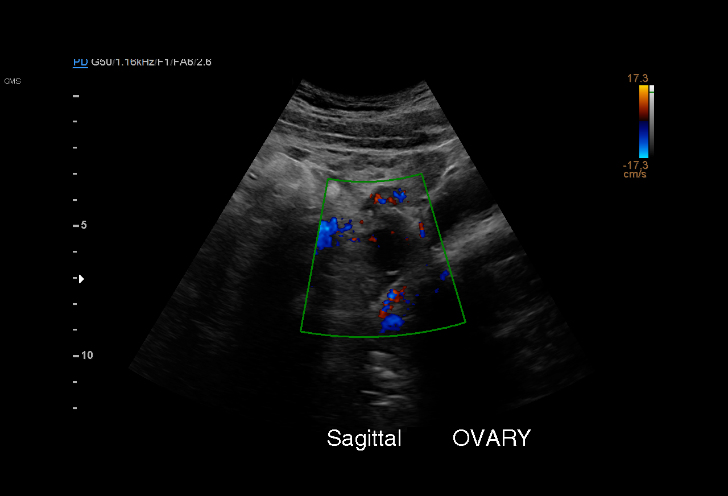
[im 15/41]
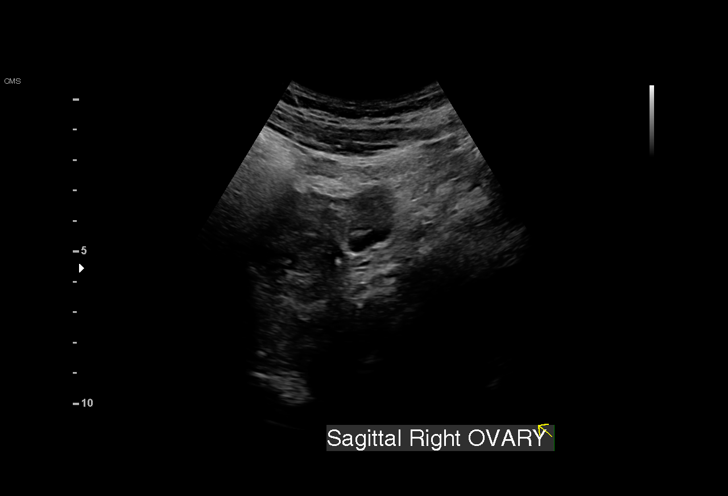
[im 18/41]
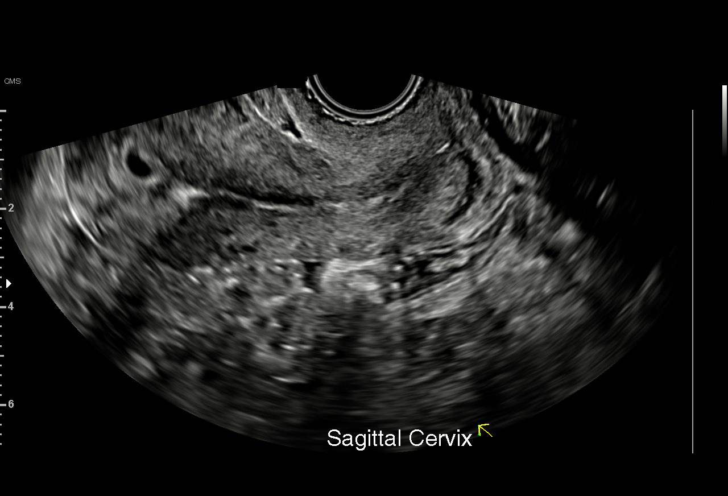
[im 21/41]
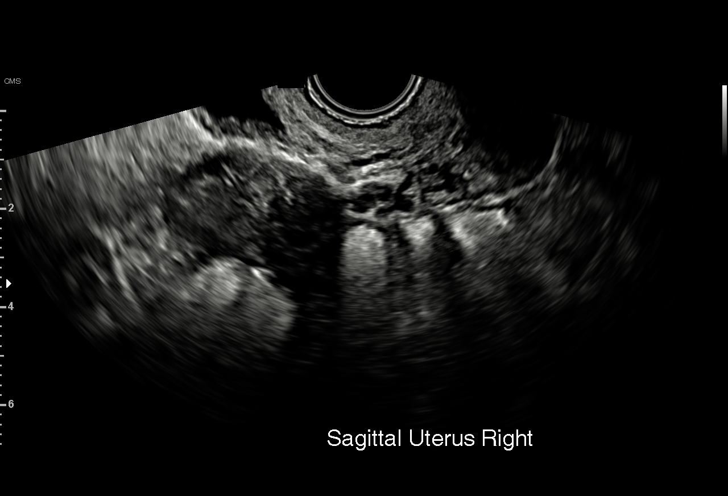
[im 23/41]
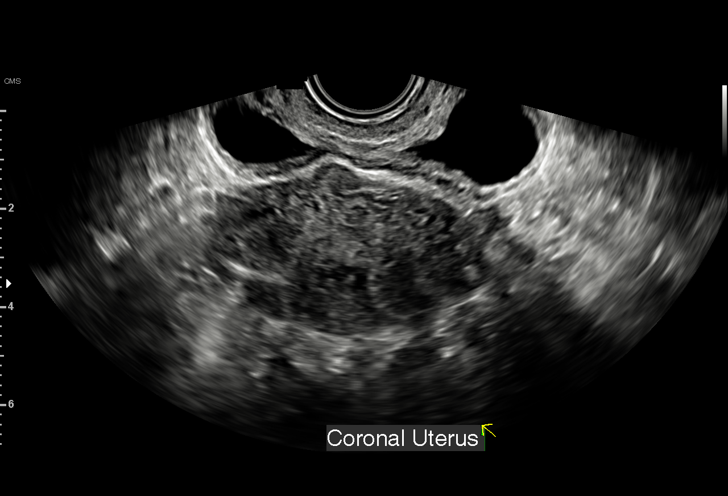
[im 26/41]
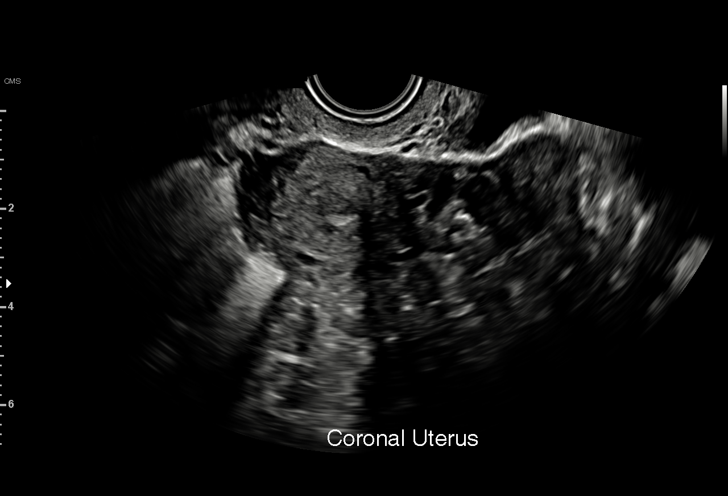
[im 29/41]
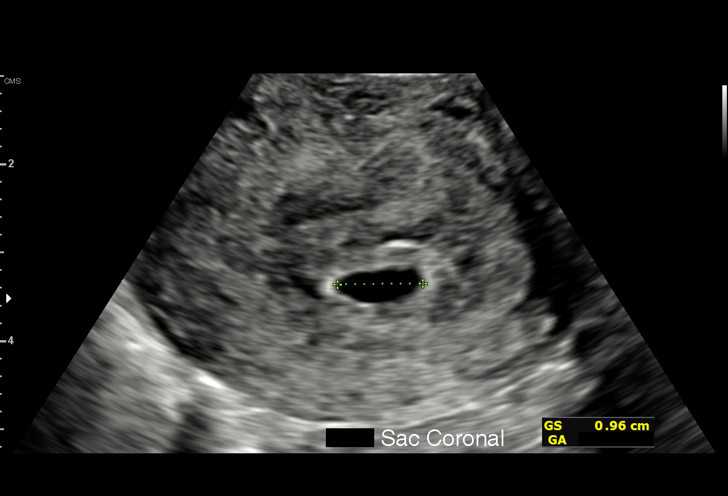
[im 32/41]
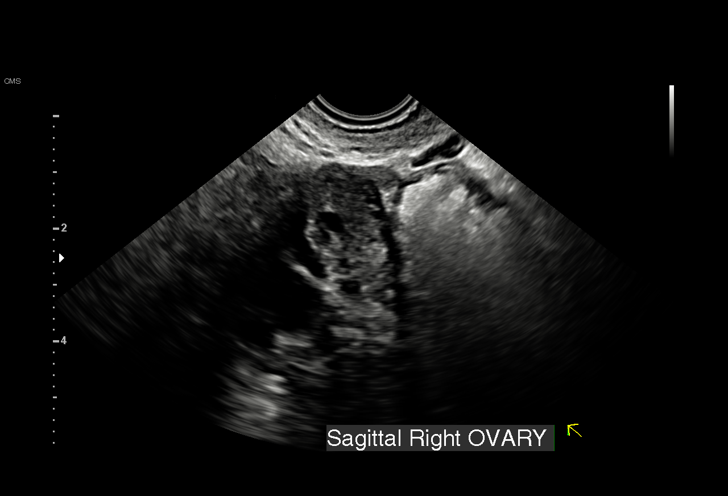
[im 35/41]
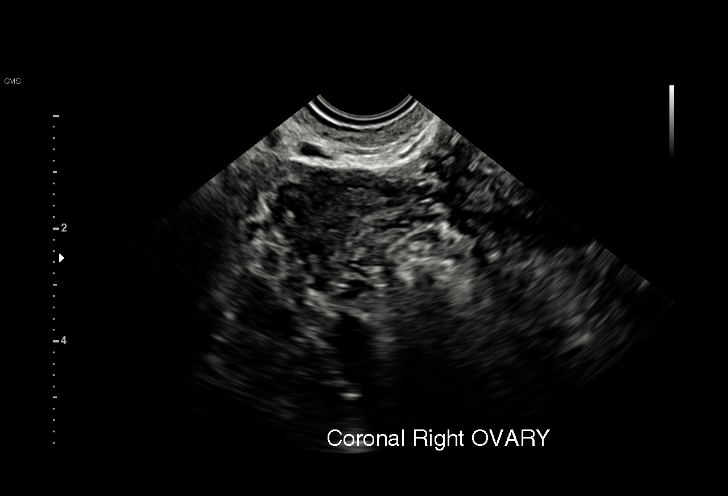
[im 38/41]
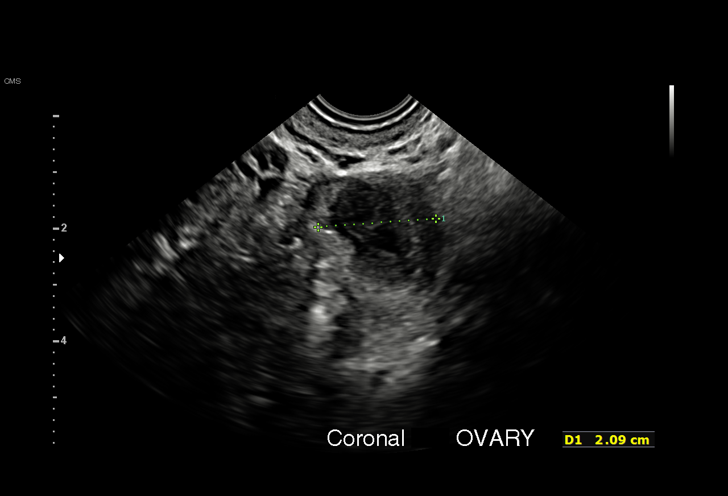
[im 41/41]
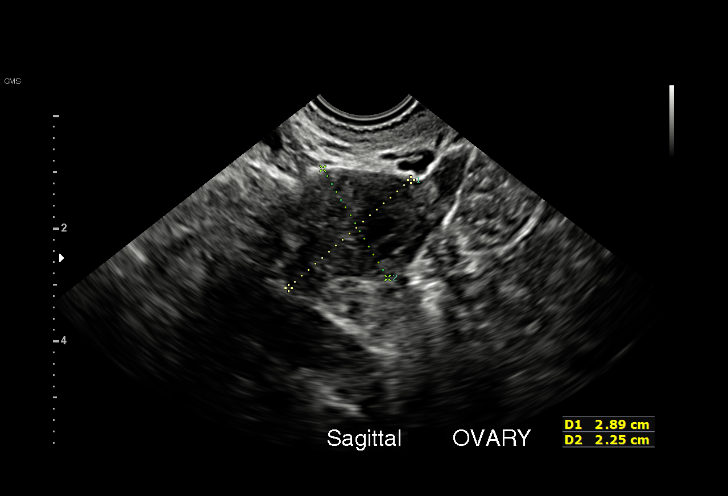

[15 of 28 positions shown; findings below may reference images not displayed]

FINDINGS: Intrauterine gestational sac: Single

Yolk sac:  Not Visualized.

Embryo:  Not Visualized.

Cardiac Activity: Not Visualized.

MSD: 7.0 mm   5 w   2 d

Subchorionic hemorrhage:  None visualized.

Maternal uterus/adnexae: Bilateral ovaries and adnexal regions
within normal limits. No free fluid within the pelvis.
IMPRESSION: Probable early intrauterine gestational sac, but no yolk sac, fetal
pole, or cardiac activity yet visualized. Recommend follow-up
quantitative B-HCG levels and follow-up US in 14 days to assess
viability. This recommendation follows SRU consensus guidelines:
Diagnostic Criteria for Nonviable Pregnancy Early in the First
Trimester. N Engl J Med 5590; [DATE].

## 2022-12-12 ENCOUNTER — Ambulatory Visit (INDEPENDENT_AMBULATORY_CARE_PROVIDER_SITE_OTHER): Payer: Medicaid Other

## 2022-12-12 ENCOUNTER — Other Ambulatory Visit (HOSPITAL_COMMUNITY)
Admission: RE | Admit: 2022-12-12 | Discharge: 2022-12-12 | Disposition: A | Discharge status: Home / Self Care | Payer: Medicaid Other | Source: Ambulatory Visit | Attending: Family Medicine | Admitting: Family Medicine

## 2022-12-12 ENCOUNTER — Other Ambulatory Visit: Payer: Self-pay

## 2022-12-12 VITALS — BP 130/86 | Ht 63.5 in | Wt 173.1 lb

## 2022-12-12 DIAGNOSIS — N898 Other specified noninflammatory disorders of vagina: Secondary | ICD-10-CM | POA: Insufficient documentation

## 2022-12-12 LAB — POCT PREGNANCY, URINE: Preg Test, Ur: NEGATIVE

## 2022-12-12 MED ORDER — METRONIDAZOLE 500 MG PO TABS
500.0000 mg | ORAL_TABLET | Freq: Two times a day (BID) | ORAL | 0 refills | Status: DC
Start: 2022-12-12 — End: 2023-02-07

## 2022-12-12 NOTE — Progress Notes (Signed)
Natasha Kelly is here with concern of vaginal itching, clear discharge with "indescribable smell" per patient. These symptoms have been present for 2 weeks. Patient reports she has tried nothing to resolve symptoms.  Pertinent history: hx of gonorrhea, chlamydia, and trich; CV swab done on 09/13/22 resulted positive for BV and candida glabrata  Plan of care:   Self swab instructions given and specimen obtained--patient requested to be tested for BV, yeast, and GC/CT and trich. Explained patient will be contacted with any abnormal results. Sent in Rx order for flagyl per protocol. Patient would like to complete UPT "just to be safe" after talking to friends who got pregnant while on birth control--UPT in office today was negative. Patient is not due for annual exam.  Meryl Crutch, RN 12/12/2022  2:44 PM

## 2023-01-24 NOTE — Progress Notes (Signed)
Patient was assessed and managed by nursing staff during this encounter. I have reviewed the chart and agree with the documentation and plan.   Lorriane Shire, MD 01/24/23 1:07 PM

## 2023-02-07 ENCOUNTER — Encounter (HOSPITAL_COMMUNITY): Payer: Self-pay

## 2023-02-07 ENCOUNTER — Emergency Department (HOSPITAL_COMMUNITY)
Admission: EM | Admit: 2023-02-07 | Discharge: 2023-02-07 | Disposition: A | Discharge status: Home / Self Care | Payer: Medicaid Other | Attending: Emergency Medicine | Admitting: Emergency Medicine

## 2023-02-07 ENCOUNTER — Other Ambulatory Visit: Payer: Self-pay

## 2023-02-07 DIAGNOSIS — Z202 Contact with and (suspected) exposure to infections with a predominantly sexual mode of transmission: Secondary | ICD-10-CM

## 2023-02-07 DIAGNOSIS — N760 Acute vaginitis: Secondary | ICD-10-CM | POA: Insufficient documentation

## 2023-02-07 DIAGNOSIS — B9689 Other specified bacterial agents as the cause of diseases classified elsewhere: Secondary | ICD-10-CM | POA: Diagnosis not present

## 2023-02-07 DIAGNOSIS — B379 Candidiasis, unspecified: Secondary | ICD-10-CM | POA: Insufficient documentation

## 2023-02-07 LAB — URINALYSIS, ROUTINE W REFLEX MICROSCOPIC
Bilirubin Urine: NEGATIVE
Glucose, UA: NEGATIVE mg/dL
Ketones, ur: NEGATIVE mg/dL
Nitrite: NEGATIVE
Protein, ur: 30 mg/dL — AB
Specific Gravity, Urine: 1.025 (ref 1.005–1.030)
pH: 6 (ref 5.0–8.0)

## 2023-02-07 LAB — WET PREP, GENITAL
Sperm: NONE SEEN
Trich, Wet Prep: NONE SEEN
WBC, Wet Prep HPF POC: <10 (ref <10)

## 2023-02-07 LAB — HIV ANTIBODY (ROUTINE TESTING W REFLEX): HIV Screen 4th Generation wRfx: NONREACTIVE

## 2023-02-07 MED ORDER — METRONIDAZOLE 500 MG PO TABS: 500.0000 mg | ORAL_TABLET | Freq: Two times a day (BID) | ORAL | 0 refills | Status: DC

## 2023-02-07 MED ORDER — FLUCONAZOLE 150 MG PO TABS: 150.0000 mg | ORAL_TABLET | Freq: Every day | ORAL | 0 refills | Status: DC

## 2023-02-07 NOTE — Discharge Instructions (Signed)
Please take the entire course of antibiotics that prescribed, do not drink any alcohol while you are taking this antibiotic as it can give you a painful reaction as though you are having an alcohol withdrawal.  I would refer from any sexual activity until the results of all of your STI screenings have resulted.  Please check your patient portal in 24 to 48 hours for the results of your gonorrhea, chlamydia, HIV, and syphilis (RPR) screening.  If any of these results are positive please return to the health department to the emergency department for further treatment

## 2023-02-07 NOTE — ED Triage Notes (Signed)
Pt c.o vaginal itching for 3 days with clear discharge. Pt requesting STD check because he boyfriend has been sleeping with multiple women.

## 2023-02-07 NOTE — ED Provider Notes (Signed)
[place] EMERGENCY DEPARTMENT AT [facility] Provider Note   CSN: [id number] Arrival date & time: 02/07/23  1739     History  Chief Complaint  Patient presents with   Exposure to STD    Tonyia Reichlin is a 22 y.o. female with past medical history significant for bacterial vaginosis, chlamydia, gonorrhea, trichomonas previously presents with concern for 3 days of clear discharge, and occasional itching.  Patient requesting STI testing because boyfriend has been sleeping with multiple women.  She denies any fever, chills, or significant lower abdominal pain.   Exposure to STD       Home Medications Prior to Admission medications   Medication Sig Start Date End Date Taking? Authorizing Provider  fluconazole (DIFLUCAN) 150 MG tablet Take 1 tablet (150 mg total) by mouth daily. Take second tablet in 3 days if you continue to have any symptoms of yeast infection 02/07/23  Yes Roopa Graver H, PA-C  metroNIDAZOLE (FLAGYL) 500 MG tablet Take 1 tablet (500 mg total) by mouth 2 (two) times daily. 02/07/23  Yes Altheia Shafran H, PA-C  doxycycline (VIBRA-TABS) 100 MG tablet Take 1 tablet (100 mg total) by mouth 2 (two) times daily. Patient not taking: Reported on 12/12/2022 11/14/22   Henderly, Britni A, PA-C  etonogestrel (NEXPLANON) 68 MG IMPL implant 1 each by Subdermal route once.    [provider]      Allergies    Patient has no known allergies.    Review of Systems   Review of Systems  All other systems reviewed and are negative.   Physical Exam Updated Vital Signs BP 130/82 (BP Location: Right Arm)   Pulse (!) 108   Temp 98.8 F (37.1 C) (Oral)   Resp 16   Ht 5\' 3"  (1.6 m)   Wt 70.3 kg   LMP 02/07/2023 (Exact Date)   SpO2 96%   BMI 27.46 kg/m  Physical Exam Vitals and nursing note reviewed.  Constitutional:      General: She is not in acute distress.    Appearance: Normal appearance.  HENT:     Head: Normocephalic and atraumatic.   Eyes:     General:        Right eye: No discharge.        Left eye: No discharge.  Cardiovascular:     Rate and Rhythm: Normal rate and regular rhythm.     Heart sounds: No murmur heard.    No friction rub. No gallop.  Pulmonary:     Effort: Pulmonary effort is normal.     Breath sounds: Normal breath sounds.  Abdominal:     General: Bowel sounds are normal.     Palpations: Abdomen is soft.  Genitourinary:    Comments: Patient declined pelvic exam, reports she will self swab Skin:    General: Skin is warm and dry.     Capillary Refill: Capillary refill takes less than 2 seconds.  Neurological:     Mental Status: She is alert and oriented to person, place, and time.  Psychiatric:        Mood and Affect: Mood normal.        Behavior: Behavior normal.     ED Results / Procedures / Treatments   Labs (all labs ordered are listed, but only abnormal results are displayed) Labs Reviewed  WET PREP, GENITAL - Abnormal; Notable for the following components:      Result Value   Yeast Wet Prep HPF POC PRESENT (*)  Clue Cells Wet Prep HPF POC PRESENT (*)    All other components within normal limits  URINALYSIS, ROUTINE W REFLEX MICROSCOPIC - Abnormal; Notable for the following components:   APPearance CLOUDY (*)    Hgb urine dipstick MODERATE (*)    Protein, ur 30 (*)    Leukocytes,Ua TRACE (*)    Bacteria, UA RARE (*)    All other components within normal limits  HIV ANTIBODY (ROUTINE TESTING W REFLEX)  RPR  PREGNANCY, URINE  POC URINE PREG, ED  GC/CHLAMYDIA PROBE AMP ([place]) NOT AT [facility]    EKG None  Radiology No results found.  Procedures Procedures    Medications Ordered in ED Medications - No data to display  ED Course/ Medical Decision Making/ A&P                                 Medical Decision Making Amount and/or Complexity of Data Reviewed Labs: ordered.  Risk Prescription drug management.   This patient is a 22 y.o. female who presents  to the ED for concern of possible STI exposure.   Differential diagnoses prior to evaluation: STI, PID, trichomonas, BV, UTI, versus other  Past Medical History / Social History / Additional history: Chart reviewed. Pertinent results include: Previous trichomonas, gonorrhea, chlamydia, BV  Physical Exam: Physical exam performed. The pertinent findings include: Mild tachycardia, pulse 108 on arrival, improved on recheck.  Vital signs otherwise stable.  Patient declined pelvic exam, reports she will self swab.  Given lack of suprapubic tenderness to palpation, description of some mild vaginal itching I think this is reasonable.  I independently interpreted UA which shows moderate hemoglobin, trace leukocytes, rare bacteria, but with squamous contamination, not compatible with acute urinary tract infection.  Wet prep shows clue cells, and yeast  Medications / Treatment: Will treat with Flagyl, Diflucan, otherwise pending results of STI screenings, patient can return to the emergency department or follow-up with the health department for any positive results on gonorrhea, chlamydia, RPR.  Encourage safe sex practices, refraining from any sexual activity until finishing antibiotics and all STI screens are clear.   Disposition: After consideration of the diagnostic results and the patients response to treatment, I feel that patient stable for discharge with plan as above.   emergency department workup does not suggest an emergent condition requiring admission or immediate intervention beyond what has been performed at this time. The plan is: as above. The patient is safe for discharge and has been instructed to return immediately for worsening symptoms, change in symptoms or any other concerns.  Final Clinical Impression(s) / ED Diagnoses Final diagnoses:  Possible exposure to STD  BV (bacterial vaginosis)  Yeast infection    Rx / DC Orders ED Discharge Orders          Ordered     fluconazole (DIFLUCAN) 150 MG tablet  Daily        02/07/23 2117    metroNIDAZOLE (FLAGYL) 500 MG tablet  2 times daily        02/07/23 2117              [place] 02/07/23 2119    Charlynne Pander, MD 02/07/23 2248

## 2023-02-08 ENCOUNTER — Ambulatory Visit: Payer: Medicaid Other

## 2023-02-08 LAB — GC/CHLAMYDIA PROBE AMP (~~LOC~~) NOT AT ARMC
Chlamydia: NEGATIVE
Comment: NEGATIVE
Comment: NORMAL
Neisseria Gonorrhea: NEGATIVE

## 2023-02-08 LAB — RPR: RPR Ser Ql: NONREACTIVE

## 2023-02-20 ENCOUNTER — Other Ambulatory Visit (HOSPITAL_COMMUNITY)
Admission: RE | Admit: 2023-02-20 | Discharge: 2023-02-20 | Disposition: A | Discharge status: Home / Self Care | Payer: Medicaid Other | Source: Ambulatory Visit | Attending: Obstetrics and Gynecology | Admitting: Obstetrics and Gynecology

## 2023-02-20 ENCOUNTER — Ambulatory Visit (INDEPENDENT_AMBULATORY_CARE_PROVIDER_SITE_OTHER): Payer: Medicaid Other | Admitting: Obstetrics and Gynecology

## 2023-02-20 ENCOUNTER — Encounter: Payer: Self-pay | Admitting: Obstetrics and Gynecology

## 2023-02-20 VITALS — BP 127/76 | HR 76 | Wt 155.0 lb

## 2023-02-20 DIAGNOSIS — Z124 Encounter for screening for malignant neoplasm of cervix: Secondary | ICD-10-CM | POA: Diagnosis present

## 2023-02-20 DIAGNOSIS — Z3009 Encounter for other general counseling and advice on contraception: Secondary | ICD-10-CM

## 2023-02-20 NOTE — Progress Notes (Signed)
     GYNECOLOGY OFFICE PROCEDURE NOTE  Inice Sanluis is a 22 y.o. G2P1010 here for Nexplanon removal. Last pap smear was on n/a Doing first pap smear today  No other gynecologic concerns.  Nexplanon Removal Patient identified, informed consent performed, consent signed.   Appropriate time out taken.   Nexplanon site identified.  Area prepped in usual sterile fashon. One ml of 1% lidocaine was used to anesthetize the area at the distal end of the implant. A small stab incision was made right beside the implant on the distal portion.  The Nexplanon rod was grasped using hemostats and removed without difficulty.  There was minimal blood loss. There were no complications.  3 ml of 1% lidocaine was injected around the incision for post-procedure analgesia.  Steri-strips were applied over the small incision.  A pressure bandage was applied to reduce any bruising.    The patient tolerated the procedure well and was given post procedure instructions.  Patient is planning to "use condoms for contraception.  Albertine Grates, FNP Center for [employer], [facility]

## 2023-02-26 LAB — CYTOLOGY - PAP
Diagnosis: NEGATIVE
Diagnosis: REACTIVE

## 2023-04-17 NOTE — Progress Notes (Unsigned)
Natasha Kelly is here with concern of discharge--clear and yellow in color with fishy smell along with mild vaginal itching. These symptoms have been present for 2 weeks. Patient reports she has tried the following to resolve symptoms: yeast cream and OTC BV treatment--nothing worked  Pertinent history: hx of yeast infections  Plan of care:   Self swab instructions given and specimen obtained--patient requested that we add GC/CT to yeast/BV testing. Explained patient will be contacted with any abnormal results. Patient did request sending in treatment before confirmation of results d/t appropriate symptoms; Rx for BV as well as yeast sent into patient's pharmacy, per protocol. Patient is due for annual on 05/30/23.   Meryl Crutch, RN 04/18/23 at [phone]

## 2023-04-18 ENCOUNTER — Other Ambulatory Visit (HOSPITAL_COMMUNITY)
Admission: RE | Admit: 2023-04-18 | Discharge: 2023-04-18 | Disposition: A | Discharge status: Home / Self Care | Payer: Medicaid Other | Source: Ambulatory Visit | Attending: Family Medicine | Admitting: Family Medicine

## 2023-04-18 ENCOUNTER — Ambulatory Visit: Payer: Medicaid Other

## 2023-04-18 VITALS — BP 115/70 | HR 92 | Ht 63.0 in | Wt 169.1 lb

## 2023-04-18 DIAGNOSIS — N898 Other specified noninflammatory disorders of vagina: Secondary | ICD-10-CM | POA: Insufficient documentation

## 2023-04-18 MED ORDER — METRONIDAZOLE 500 MG PO TABS
500.0000 mg | ORAL_TABLET | Freq: Two times a day (BID) | ORAL | 0 refills | Status: AC
Start: 2023-04-18 — End: 2023-04-25

## 2023-04-18 MED ORDER — FLUCONAZOLE 150 MG PO TABS
150.0000 mg | ORAL_TABLET | Freq: Once | ORAL | 0 refills | Status: AC
Start: 2023-04-18 — End: 2023-04-18

## 2023-04-20 LAB — CERVICOVAGINAL ANCILLARY ONLY
Bacterial Vaginitis (gardnerella): POSITIVE — AB
Candida Glabrata: NEGATIVE
Candida Vaginitis: NEGATIVE
Chlamydia: NEGATIVE
Comment: NEGATIVE
Comment: NEGATIVE
Comment: NEGATIVE
Comment: NEGATIVE
Comment: NEGATIVE
Comment: NORMAL
Neisseria Gonorrhea: NEGATIVE
Trichomonas: NEGATIVE

## 2023-05-13 ENCOUNTER — Encounter (HOSPITAL_COMMUNITY): Payer: Self-pay

## 2023-05-13 ENCOUNTER — Other Ambulatory Visit: Payer: Self-pay

## 2023-05-13 ENCOUNTER — Emergency Department (HOSPITAL_COMMUNITY)
Admission: EM | Admit: 2023-05-13 | Discharge: 2023-05-13 | Disposition: A | Discharge status: Home / Self Care | Payer: Medicaid Other | Attending: Emergency Medicine | Admitting: Emergency Medicine

## 2023-05-13 DIAGNOSIS — N898 Other specified noninflammatory disorders of vagina: Secondary | ICD-10-CM | POA: Insufficient documentation

## 2023-05-13 DIAGNOSIS — R102 Pelvic and perineal pain: Secondary | ICD-10-CM | POA: Diagnosis present

## 2023-05-13 LAB — URINALYSIS, ROUTINE W REFLEX MICROSCOPIC
Bilirubin Urine: NEGATIVE
Glucose, UA: NEGATIVE mg/dL
Hgb urine dipstick: NEGATIVE
Ketones, ur: 5 mg/dL — AB
Leukocytes,Ua: NEGATIVE
Nitrite: NEGATIVE
Protein, ur: NEGATIVE mg/dL
Specific Gravity, Urine: 1.029 (ref 1.005–1.030)
pH: 5 (ref 5.0–8.0)

## 2023-05-13 LAB — WET PREP, GENITAL
Clue Cells Wet Prep HPF POC: NONE SEEN
Sperm: NONE SEEN
Trich, Wet Prep: NONE SEEN
WBC, Wet Prep HPF POC: <10 (ref <10)
Yeast Wet Prep HPF POC: NONE SEEN

## 2023-05-13 LAB — HSV 1/2 PCR (SURFACE)
HSV-1 DNA: NOT DETECTED
HSV-2 DNA: DETECTED — AB

## 2023-05-13 MED ORDER — VALACYCLOVIR HCL 1 G PO TABS: 1000.0000 mg | ORAL_TABLET | Freq: Three times a day (TID) | ORAL | 0 refills | Status: DC

## 2023-05-13 MED ORDER — DOXYCYCLINE HYCLATE 100 MG PO CAPS: 100.0000 mg | ORAL_CAPSULE | Freq: Two times a day (BID) | ORAL | 0 refills | Status: DC

## 2023-05-13 MED ORDER — STERILE WATER FOR INJECTION IJ SOLN
INTRAMUSCULAR | Status: AC
  Administered 2023-05-13: 1 mL
  Filled 2023-05-13: qty 10

## 2023-05-13 MED ORDER — CEFTRIAXONE SODIUM 500 MG IJ SOLR
500.0000 mg | Freq: Once | INTRAMUSCULAR | Status: AC
  Administered 2023-05-13: 500 mg via INTRAMUSCULAR
  Filled 2023-05-13: qty 500

## 2023-05-13 MED ORDER — VALACYCLOVIR HCL 500 MG PO TABS
1000.0000 mg | ORAL_TABLET | ORAL | Status: AC
  Administered 2023-05-13: 1000 mg via ORAL
  Filled 2023-05-13: qty 2

## 2023-05-13 MED ORDER — DOXYCYCLINE HYCLATE 100 MG PO TABS
100.0000 mg | ORAL_TABLET | Freq: Once | ORAL | Status: AC
  Administered 2023-05-13: 100 mg via ORAL
  Filled 2023-05-13: qty 1

## 2023-05-13 NOTE — Discharge Instructions (Signed)
As we discussed I am concerned that you may have a herpes infection.  I have prescribed Valtrex 3 times daily for a week  We have also send off gonorrhea chlamydia test.  You were treated for gonorrhea.  For chlamydia, I have prescribed doxycycline twice daily for a week  You should be able to see your results on MyChart.  If you are positive you need to let your partner know and he needs treatment  Return to ER if you have worse discharge or severe pain

## 2023-05-13 NOTE — ED Provider Notes (Signed)
[place] EMERGENCY DEPARTMENT AT [facility] Provider Note   CSN: [id number] Arrival date & time: 05/13/23  1103     History  Chief Complaint  Patient presents with   Vaginal Pain    Brianah Bava is a 22 y.o. female history of bacterial vaginosis here presenting with vaginal pain and also persistent discharge.  Patient states that she recently finished a course of antibiotics for bacterial vaginosis.  She states that she noticed some painful lesions on her vagina.  She states that she has been with the same partner.  The history is provided by the patient.       Home Medications Prior to Admission medications   Not on File      Allergies    Patient has no known allergies.    Review of Systems   Review of Systems  Genitourinary:  Positive for vaginal pain.  All other systems reviewed and are negative.   Physical Exam Updated Vital Signs BP 120/75 (BP Location: Left Arm)   Pulse 97   Temp 98.1 F (36.7 C)   Resp 17   Ht 5\' 3"  (1.6 m)   Wt 76.7 kg   LMP 04/04/2023 (Approximate)   SpO2 98%   BMI 29.94 kg/m  Physical Exam Vitals and nursing note reviewed.  Constitutional:      Appearance: Normal appearance.  HENT:     Head: Normocephalic.     Nose: Nose normal.     Mouth/Throat:     Mouth: Mucous membranes are moist.  Cardiovascular:     Rate and Rhythm: Normal rate.     Pulses: Normal pulses.     Heart sounds: Normal heart sounds.  Pulmonary:     Effort: Pulmonary effort is normal.     Breath sounds: Normal breath sounds.  Abdominal:     General: Abdomen is flat.     Palpations: Abdomen is soft.  Genitourinary:    Comments: Patient has painful vesicles on the left side of her of her vagina.  Patient also has whitish discharge. Musculoskeletal:     Cervical back: Normal range of motion.  Skin:    General: Skin is warm.     Capillary Refill: Capillary refill takes less than 2 seconds.  Neurological:     General: No focal deficit  present.     Mental Status: She is alert and oriented to person, place, and time.  Psychiatric:        Mood and Affect: Mood normal.        Behavior: Behavior normal.     ED Results / Procedures / Treatments   Labs (all labs ordered are listed, but only abnormal results are displayed) Labs Reviewed  URINALYSIS, ROUTINE W REFLEX MICROSCOPIC - Abnormal; Notable for the following components:      Result Value   APPearance HAZY (*)    Ketones, ur 5 (*)    All other components within normal limits  WET PREP, GENITAL  PREGNANCY, URINE  HSV 1/2 PCR (SURFACE)  POC URINE PREG, ED  GC/CHLAMYDIA PROBE AMP ([place]) NOT AT [facility]    EKG None  Radiology No results found.  Procedures Procedures    Medications Ordered in ED Medications  cefTRIAXone (ROCEPHIN) injection 500 mg (has no administration in time range)  doxycycline (VIBRA-TABS) tablet 100 mg (has no administration in time range)  valACYclovir (VALTREX) tablet 1,000 mg (has no administration in time range)    ED Course/ Medical Decision Making/ A&P  Medical Decision Making Jeweldine Thurnau is a 22 y.o. female here presenting with painful vesicles.  Patient's UA is unremarkable and wet prep is negative.  I am concerned for possible herpes infection.  I was able to swab the lesions.  Patient wants empiric treatment for GC chlamydia and also herpes.   Problems Addressed: Vaginal pain: acute illness or injury  Risk Prescription drug management.    Final Clinical Impression(s) / ED Diagnoses Final diagnoses:  None    Rx / DC Orders ED Discharge Orders     None         Charlynne Pander, MD 05/13/23 1819

## 2023-05-13 NOTE — ED Triage Notes (Signed)
Patient reports there is a bump that is painful in her genitals.  Patient also reports clear discharge but about a month ago was dx with BV and unsure if it went away.

## 2023-05-13 NOTE — ED Provider Triage Note (Cosign Needed)
Emergency Medicine Provider Triage Evaluation Note  Natasha Kelly , a 22 y.o. female  was evaluated in triage.  Pt complains of itching and pain between anus and vagina. Reports clear vaginal discharge. No known rash. Is sexually active.  Review of Systems  Positive: Itching Negative: fevers  Physical Exam  BP 120/75 (BP Location: Right Arm)   Pulse 90   Temp 98.4 F (36.9 C)   Resp 17   LMP 04/04/2023 (Approximate)   SpO2 92%  Gen:   Awake, no distress   Resp:  Normal effort  MSK:   Moves extremities without difficulty  Other:  GU exam deferred per pt comfort  Medical Decision Making  Medically screening exam initiated at 12:01 PM.  Appropriate orders placed.  Brijette Rombach was informed that the remainder of the evaluation will be completed by another provider, this initial triage assessment does not replace that evaluation, and the importance of remaining in the ED until their evaluation is complete.    Pete Pelt, [place] 05/13/23 1203

## 2023-05-13 NOTE — ED Notes (Signed)
..  The patient is A&OX4, ambulatory at d/c with independent steady gait, NAD. Pt verbalized understanding of d/c instructions, prescriptions and follow up care.  

## 2023-05-14 LAB — GC/CHLAMYDIA PROBE AMP (~~LOC~~) NOT AT ARMC
Chlamydia: NEGATIVE
Comment: NEGATIVE
Comment: NORMAL
Neisseria Gonorrhea: NEGATIVE

## 2023-05-16 NOTE — L&D Delivery Note (Cosign Needed Addendum)
 OB/GYN Faculty Practice Delivery Note  Natasha Kelly is a 23 y.o. G3P1011 s/p SVD at [redacted]w[redacted]d. She was admitted for IOL for elevated BP.   ROM: 1h 13m with light meconium fluid GBS Status: Positive/-- (11/24 1113) - treated with PCN   Labor Progress: Initial SVE: 5.5/80/-2. She then progressed to complete.   Delivery Date/Time: 04/29/24 at 1854 Delivery: Called to room and patient was complete and pushing. Head delivered OA. No nuchal cord present. Shoulder and body delivered in usual fashion. Infant with spontaneous cry, placed on mother's abdomen, dried and stimulated. Cord clamped x 2 after 1-minute delay, and cut by FOB. Placenta delivered spontaneously with gentle cord traction. Fundus firm with massage and Pitocin . Labia, perineum, vagina, and cervix inspected inspected with lacerations as below.  Baby Weight: pending  Placenta: Sent to L&D Complications: None Lacerations: left periurethral with repair, superficial perineal without repair EBL: 350 mL Analgesia: Epidural   Infant:  APGAR (1 MIN):   APGAR (5 MINS): 9   Virali Bhagat, DO 04/29/2024 7:44 PM  Midwife Attestation:  I personally saw and evaluated the patient, performing the key elements of the service. I developed and verified the management plan that is described in the resident's/student's note, and I agree with the content with my edits above. VSS, HRR&R, Resp unlabored, Legs neg.    Olam Boards, CNM 12:05 AM

## 2023-05-25 ENCOUNTER — Telehealth: Payer: Self-pay | Admitting: *Deleted

## 2023-05-25 NOTE — Telephone Encounter (Signed)
 Pt called regarding positive test results for STI.  RNCM contacted ER Navigator and reviewed chart with her.  Pt was given Rx at discharge (Valtrex ) and may begin taking it.  Call was disconnected while pt was on hold.  RNCM will contact pt to advise to begin taking Rx prescribed.

## 2023-07-25 ENCOUNTER — Other Ambulatory Visit: Payer: Self-pay

## 2023-07-25 ENCOUNTER — Encounter (HOSPITAL_COMMUNITY): Payer: Self-pay | Admitting: *Deleted

## 2023-07-25 ENCOUNTER — Emergency Department (HOSPITAL_COMMUNITY)
Admission: EM | Admit: 2023-07-25 | Discharge: 2023-07-25 | Disposition: A | Discharge status: Home / Self Care | Attending: Emergency Medicine | Admitting: Emergency Medicine

## 2023-07-25 DIAGNOSIS — Z202 Contact with and (suspected) exposure to infections with a predominantly sexual mode of transmission: Secondary | ICD-10-CM | POA: Insufficient documentation

## 2023-07-25 LAB — WET PREP, GENITAL
Clue Cells Wet Prep HPF POC: NONE SEEN
Sperm: NONE SEEN
Trich, Wet Prep: NONE SEEN
WBC, Wet Prep HPF POC: <10 (ref <10)
Yeast Wet Prep HPF POC: NONE SEEN

## 2023-07-25 LAB — HIV ANTIBODY (ROUTINE TESTING W REFLEX): HIV Screen 4th Generation wRfx: NONREACTIVE

## 2023-07-25 NOTE — ED Triage Notes (Signed)
 The pt was diagnosed with herpes 2 months ago  she wants to be checked for  all the other stds now lmp today

## 2023-07-25 NOTE — ED Provider Triage Note (Signed)
 Emergency Medicine Provider Triage Evaluation Note  Natasha Kelly , a 23 y.o. female  was evaluated in triage.  Pt complains of STD.  Review of Systems  Positive:  Negative:   Physical Exam  BP 138/63   Pulse (!) 116   Temp 98.2 F (36.8 C)   Resp 18   Ht 5\' 3"  (1.6 m)   Wt 76.7 kg   LMP 07/25/2023   SpO2 98%   BMI 29.95 kg/m  Gen:   Awake, no distress   Resp:  Normal effort  MSK:   Moves extremities without difficulty  Other:    Medical Decision Making  Medically screening exam initiated at 4:52 PM.  Appropriate orders placed.  Natasha Kelly was informed that the remainder of the evaluation will be completed by another provider, this initial triage assessment does not replace that evaluation, and the importance of remaining in the ED until their evaluation is complete.  STD testing   Dorthy Cooler, [place] 07/25/23 1308

## 2023-07-25 NOTE — ED Provider Notes (Signed)
 [place] EMERGENCY DEPARTMENT AT [facility] Provider Note   CSN: [id number] Arrival date & time: 07/25/23  1606     History  No chief complaint on file.   Natasha Kelly is a 23 y.o. female with PMHx anemia, STD's, HSV who presents to the ED requesting STD testing. Patient stating that she was recently diagnosed with HSV by her GYN, but she was concerned because they did not do any further testing for STD's. Patient stating that she had mild pelvic cramping yesterday, and she started her menstrual cycle today, but denies any fever, nausea, vomiting, diarrhea, abdominal pain, hematochezia, dysuria, hematuria, vaginal discharge.   HPI     Home Medications Prior to Admission medications   Medication Sig Start Date End Date Taking? Authorizing Provider  doxycycline (VIBRAMYCIN) 100 MG capsule Take 1 capsule (100 mg total) by mouth 2 (two) times daily. One po bid x 7 days 05/13/23   Charlynne Pander, MD  valACYclovir (VALTREX) 1000 MG tablet Take 1 tablet (1,000 mg total) by mouth 3 (three) times daily. 05/13/23   Charlynne Pander, MD      Allergies    Patient has no known allergies.    Review of Systems   Review of Systems  Constitutional:        Requesting STD testing    Physical Exam Updated Vital Signs BP 138/63   Pulse (!) 116   Temp 98.2 F (36.8 C)   Resp 18   Ht 5\' 3"  (1.6 m)   Wt 76.7 kg   LMP 07/25/2023   SpO2 98%   BMI 29.95 kg/m  Physical Exam Vitals and nursing note reviewed.  Constitutional:      General: She is not in acute distress.    Appearance: She is not ill-appearing or toxic-appearing.  HENT:     Head: Normocephalic and atraumatic.     Mouth/Throat:     Mouth: Mucous membranes are moist.  Eyes:     General: No scleral icterus.       Right eye: No discharge.        Left eye: No discharge.     Conjunctiva/sclera: Conjunctivae normal.  Cardiovascular:     Rate and Rhythm: Normal rate and regular rhythm.     Pulses: Normal  pulses.     Heart sounds: Normal heart sounds. No murmur heard. Pulmonary:     Effort: Pulmonary effort is normal. No respiratory distress.     Breath sounds: Normal breath sounds. No wheezing, rhonchi or rales.  Abdominal:     General: Abdomen is flat. Bowel sounds are normal. There is no distension.     Palpations: Abdomen is soft.     Tenderness: There is no abdominal tenderness.  Musculoskeletal:     Right lower leg: No edema.     Left lower leg: No edema.  Skin:    General: Skin is warm and dry.     Findings: No rash.  Neurological:     General: No focal deficit present.     Mental Status: She is alert and oriented to person, place, and time. Mental status is at baseline.  Psychiatric:        Mood and Affect: Mood normal.     ED Results / Procedures / Treatments   Labs (all labs ordered are listed, but only abnormal results are displayed) Labs Reviewed  WET PREP, GENITAL  HIV ANTIBODY (ROUTINE TESTING W REFLEX)  RPR  GC/CHLAMYDIA PROBE AMP ([place]) NOT AT  [facility]    EKG None  Radiology No results found.  Procedures Procedures    Medications Ordered in ED Medications - No data to display  ED Course/ Medical Decision Making/ A&P                                 Medical Decision Making Amount and/or Complexity of Data Reviewed Labs: ordered.   This patient presents to the ED for concern of STD testing, this involves an extensive number of treatment options, and is a complaint that carries with it a high risk of complications and morbidity.  The differential diagnosis includes STD, PID, HIV, sepsis, tubo ovarian abscess   Co morbidities that complicate the patient evaluation  none   Additional history obtained:  GYN Dr. Crissie Reese   Lab Tests:  I Ordered, and personally interpreted labs.  - Wet prep: negative - HIV: negative -CG/chlamydia: pending -RPR: pending   Problem List / ED Course / Critical interventions / Medication  management  Patient presents to ED requesting STD testing. Patient started her menstrual cycle today and had some mild abdominal cramping yesterday - but is without any other alarming symptoms today. Physical exam unremarkable. Patient afebrile with stable vitals. STD testing negative so far in ED. Shared results with patient. Answered all questions. Patient understands to follow up with the rest of the STD testing with her outpatient GYN. It does not appear that patient has an emergent process today d/t her lack of symptoms currently and her reassuring lab workup.  I have reviewed the patients home medicines and have made adjustments as needed Patient afebrile with stable vitals. Provided with return precautions. Discharged in good condition.   Social Determinants of Health:  none           Final Clinical Impression(s) / ED Diagnoses Final diagnoses:  Possible exposure to STD    Rx / DC Orders ED Discharge Orders     None         Margarita Rana 07/25/23 1946    Linwood Dibbles, MD 07/25/23 2120

## 2023-07-25 NOTE — Discharge Instructions (Addendum)
 It was a pleasure caring for you today. Please follow up with your GYN. Please also seek care at the [facility] if the results for your STD results positive in the next 3 days.  Seek emergency care if experiencing any new or worsening symptoms.

## 2023-07-26 LAB — GC/CHLAMYDIA PROBE AMP (~~LOC~~) NOT AT ARMC
Chlamydia: NEGATIVE
Comment: NEGATIVE
Comment: NORMAL
Neisseria Gonorrhea: POSITIVE — AB

## 2023-07-26 LAB — RPR: RPR Ser Ql: NONREACTIVE

## 2023-07-28 ENCOUNTER — Other Ambulatory Visit: Payer: Self-pay

## 2023-07-28 ENCOUNTER — Encounter (HOSPITAL_COMMUNITY): Payer: Self-pay | Admitting: *Deleted

## 2023-07-28 ENCOUNTER — Ambulatory Visit (HOSPITAL_COMMUNITY)
Admission: EM | Admit: 2023-07-28 | Discharge: 2023-07-28 | Disposition: A | Discharge status: Home / Self Care | Attending: Internal Medicine | Admitting: Internal Medicine

## 2023-07-28 DIAGNOSIS — Z113 Encounter for screening for infections with a predominantly sexual mode of transmission: Secondary | ICD-10-CM | POA: Diagnosis not present

## 2023-07-28 MED ORDER — CEFTRIAXONE SODIUM 500 MG IJ SOLR
500.0000 mg | INTRAMUSCULAR | Status: DC
  Administered 2023-07-28: 500 mg via INTRAMUSCULAR

## 2023-07-28 MED ORDER — STERILE WATER FOR INJECTION IJ SOLN
INTRAMUSCULAR | Status: AC
  Filled 2023-07-28: qty 10

## 2023-07-28 MED ORDER — CEFTRIAXONE SODIUM 500 MG IJ SOLR
INTRAMUSCULAR | Status: AC
Start: 2023-07-28 — End: ?
  Filled 2023-07-28: qty 500

## 2023-07-28 NOTE — ED Provider Notes (Signed)
 Patient seen in ED 07/25/23 and tested positive for gonorrhea. Here today for treatment. No allergies to antibiotics. Rocephin 500mg  ordered.   Carlisle Beers, [place] 07/28/23 1031

## 2023-07-28 NOTE — ED Triage Notes (Signed)
 PT presents for treatment of STD

## 2023-08-04 ENCOUNTER — Encounter (HOSPITAL_COMMUNITY): Payer: Self-pay | Admitting: Emergency Medicine

## 2023-08-04 ENCOUNTER — Ambulatory Visit (HOSPITAL_COMMUNITY)
Admission: EM | Admit: 2023-08-04 | Discharge: 2023-08-04 | Disposition: A | Discharge status: Home / Self Care | Attending: Emergency Medicine | Admitting: Emergency Medicine

## 2023-08-04 ENCOUNTER — Other Ambulatory Visit: Payer: Self-pay

## 2023-08-04 DIAGNOSIS — N898 Other specified noninflammatory disorders of vagina: Secondary | ICD-10-CM | POA: Diagnosis present

## 2023-08-04 DIAGNOSIS — J029 Acute pharyngitis, unspecified: Secondary | ICD-10-CM | POA: Diagnosis present

## 2023-08-04 NOTE — ED Provider Notes (Signed)
 [facility]    CSN: [id number] Arrival date & time: 08/04/23  1153      History   Chief Complaint Chief Complaint  Patient presents with   Exposure to STD    HPI Natasha Kelly is a 23 y.o. female.  Patient here with several concerns She tested positive for gonorrhea 1 week ago, and was given rocephin injection. She is here requesting to be tested again. Concerned she was re-infected by partner who was not treated.  She had negative HIV and RPR testing 10 days ago but is requesting it again. Also having fishy odor from the vagina. Was negative for BV and yeast. Concerned about diagnosis of HSV from ob/gyn and wonders if it could be causing other symptoms.  Concerns about a few days of sore throat and some cough. Used nyquil and tylenol. Also had chest pain this morning that is gone. Was feeling weakness yesterday like she couldn't get up. 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 Authorizing Provider  doxycycline (VIBRAMYCIN) 100 MG capsule Take 1 capsule (100 mg total) by mouth 2 (two) times daily. One po bid x 7 days 05/13/23   Charlynne Pander, MD  valACYclovir (VALTREX) 1000 MG tablet Take 1 tablet (1,000 mg total) by mouth 3 (three) times daily. 05/13/23   Charlynne Pander, MD    Family History Family History  Problem Relation Age of Onset   Healthy Mother    Healthy Father    Asthma Sister    Cancer Maternal Grandmother    Diabetes Neg Hx    Heart disease Neg Hx    Hypertension Neg Hx    Stroke Neg Hx     Social History Social History   Tobacco Use   Smoking status: Never   Smokeless tobacco: Never  Vaping Use   Vaping status: Never Used  Substance Use Topics   Alcohol use: Never   Drug use: Never     Allergies   Patient has no known allergies.   Review of Systems Review of Systems   Physical Exam Triage Vital Signs ED Triage Vitals  Encounter Vitals Group     BP 08/04/23 1214 118/80     Systolic BP Percentile --      Diastolic BP Percentile --      Pulse Rate 08/04/23 1214 100     Resp 08/04/23 1214 18     Temp 08/04/23 1213 98.2 F (36.8  C)     Temp Source 08/04/23 1213 Oral     SpO2 08/04/23 1214 97 %     Weight --      Height --      Head Circumference --      Peak Flow --      Pain Score 08/04/23 1213 0     Pain Loc --      Pain Education --      Exclude from Growth Chart --    No data found.  Updated Vital Signs BP 118/80 (BP Location: Right Arm)   Pulse 100   Temp 98.2 F (36.8 C) (Oral)   Resp 18   LMP 07/25/2023   SpO2 97%   Visual Acuity Right Eye Distance:   Left Eye Distance:   Bilateral Distance:    Right Eye Near:   Left Eye Near:    Bilateral Near:     Physical Exam Vitals and nursing note reviewed.  Constitutional:      General: She is not in acute distress.    Appearance: She is not ill-appearing.  HENT:     Nose: No congestion or rhinorrhea.     Mouth/Throat:     Mouth: Mucous membranes are moist.     Pharynx: Oropharynx is clear. Uvula midline. No oropharyngeal exudate or posterior oropharyngeal erythema.     Tonsils: 0 on the right. 0 on the left.  Eyes:     Conjunctiva/sclera: Conjunctivae normal.  Cardiovascular:     Rate and Rhythm: Normal rate and regular  rhythm.     Pulses: Normal pulses.     Heart sounds: Normal heart sounds.  Pulmonary:     Effort: Pulmonary effort is normal.     Breath sounds: Normal breath sounds.  Musculoskeletal:     Cervical back: Normal range of motion. No rigidity or tenderness.  Lymphadenopathy:     Cervical: No cervical adenopathy.  Skin:    General: Skin is warm and dry.  Neurological:     Mental Status: She is alert and oriented to person, place, and time.     Cranial Nerves: No cranial nerve deficit.     Sensory: No sensory deficit.     Motor: No weakness.     Gait: Gait normal.  Psychiatric:        Mood and Affect: Mood is anxious.     UC Treatments / Results  Labs (all labs ordered are listed, but only abnormal results are displayed) Labs Reviewed  CERVICOVAGINAL ANCILLARY ONLY    EKG   Radiology No results found.  Procedures Procedures   Medications Ordered in UC Medications - No data to display  Initial Impression / Assessment and Plan / UC Course  I have reviewed the triage vital signs and the nursing notes.  Pertinent labs & imaging results that were available during my care of the patient were reviewed by me and considered in my medical decision making (see chart for details).  Long discussion with patient about not recommending any retesting for anything today, as she was treated with rocephin 1 week ago and gonorrhea may still be positive on the test for up to 6 weeks. I also do not recommend HIV/RPR testing as she just had negative test 10 days ago. Patient education about STDs and prevention. We will go ahead and test for BV and yeast as she now has fishy odor. Discussed other viral etiology of sore throat. Afebrile and normal throat exam. Discussed symptomatic care. For her  other concerns, she seems to have a lot of health anxiety. Does not have PCP, no recent physicals. Staff has set patient up with PCP for 3 weeks from today on 4/15. Have also advised contacting ob/gyn to  set up education appointment to discuss about STDs transmission and treatment, prevention.  E/M level 4, 30 minutes spent face to face with patient, education, chart review, lab discussion.  Final Clinical Impressions(s) / UC Diagnoses   Final diagnoses:  Vaginal discharge  Sore throat     Discharge Instructions      We will test for BV and yeast - results can take about 2-3 days No STD testing today as it is too soon since your last testing and treatment. In the meantime, always use barrier protection to prevent STDs and pregnancy.   Please follow with your new primary care provider regarding other concerns.     ED Prescriptions   None    PDMP not reviewed this encounter.   Marlow Baars, [place] 08/04/23 1332

## 2023-08-04 NOTE — ED Triage Notes (Signed)
 Pt states she was recently diagnose here with Gonorrhea and wants to check if she still have it.

## 2023-08-04 NOTE — Discharge Instructions (Signed)
 We will test for BV and yeast - results can take about 2-3 days No STD testing today as it is too soon since your last testing and treatment. In the meantime, always use barrier protection to prevent STDs and pregnancy.   Please follow with your new primary care provider regarding other concerns.

## 2023-08-06 ENCOUNTER — Telehealth (HOSPITAL_COMMUNITY): Payer: Self-pay

## 2023-08-06 LAB — CERVICOVAGINAL ANCILLARY ONLY
Bacterial Vaginitis (gardnerella): POSITIVE — AB
Candida Glabrata: POSITIVE — AB
Candida Vaginitis: NEGATIVE
Comment: NEGATIVE
Comment: NEGATIVE
Comment: NEGATIVE

## 2023-08-06 MED ORDER — FLUCONAZOLE 150 MG PO TABS: 150.0000 mg | ORAL_TABLET | Freq: Once | ORAL | 0 refills | Status: AC

## 2023-08-06 MED ORDER — METRONIDAZOLE 500 MG PO TABS: 500.0000 mg | ORAL_TABLET | Freq: Two times a day (BID) | ORAL | 0 refills | Status: AC

## 2023-08-06 NOTE — Telephone Encounter (Signed)
 Per protocol, pt requires tx with metronidazole and Diflucan.  Rx sent to pharmacy on file.

## 2023-08-24 ENCOUNTER — Other Ambulatory Visit (HOSPITAL_COMMUNITY)
Admission: RE | Admit: 2023-08-24 | Discharge: 2023-08-24 | Disposition: A | Discharge status: Home / Self Care | Source: Ambulatory Visit | Attending: Family Medicine | Admitting: Family Medicine

## 2023-08-24 ENCOUNTER — Ambulatory Visit

## 2023-08-24 ENCOUNTER — Other Ambulatory Visit: Payer: Self-pay

## 2023-08-24 VITALS — BP 122/85 | HR 88 | Wt 164.6 lb

## 2023-08-24 DIAGNOSIS — N898 Other specified noninflammatory disorders of vagina: Secondary | ICD-10-CM | POA: Diagnosis present

## 2023-08-24 DIAGNOSIS — A549 Gonococcal infection, unspecified: Secondary | ICD-10-CM

## 2023-08-24 NOTE — Patient Instructions (Addendum)
 Treatment for Candida glabrata: Boric Acid (600 mg) vaginal suppository at bedtime for 14 days Purchase over the counter May notice increased vaginal discharge during use

## 2023-08-24 NOTE — Progress Notes (Signed)
 Natasha Kelly is here with concern of vaginal itching. Gonorrhea seen on vaginal swab 07/25/23. Rocephin injection received 07/28/23. BV and Candida glabrata found on vaginal swab completed at urgent care on 08/04/23. Completed prescribed Flagyl and Diflucan. Reviewed with Nobie Putnam, MD who recommends recollection of vaginal swab and treatment of Candida glabrata with boric acid; instructions provided in AVS. Office will notify patient of any abnormal results.  Marjo Bicker, RN 08/24/2023  9:44 AM

## 2023-08-27 ENCOUNTER — Encounter: Payer: Self-pay | Admitting: Obstetrics and Gynecology

## 2023-08-27 ENCOUNTER — Encounter: Payer: Self-pay | Admitting: Family Medicine

## 2023-08-28 ENCOUNTER — Ambulatory Visit: Admitting: Family

## 2023-08-28 LAB — CERVICOVAGINAL ANCILLARY ONLY
Bacterial Vaginitis (gardnerella): NEGATIVE
Candida Glabrata: NEGATIVE
Candida Vaginitis: NEGATIVE
Chlamydia: NEGATIVE
Comment: NEGATIVE
Comment: NEGATIVE
Comment: NEGATIVE
Comment: NEGATIVE
Comment: NEGATIVE
Comment: NORMAL
Neisseria Gonorrhea: NEGATIVE
Trichomonas: NEGATIVE

## 2023-09-03 ENCOUNTER — Inpatient Hospital Stay (HOSPITAL_COMMUNITY)

## 2023-09-03 ENCOUNTER — Encounter: Payer: Self-pay | Admitting: Family Medicine

## 2023-09-03 ENCOUNTER — Inpatient Hospital Stay (HOSPITAL_COMMUNITY)
Admission: AD | Admit: 2023-09-03 | Discharge: 2023-09-03 | Disposition: A | Discharge status: Home / Self Care | Attending: Obstetrics and Gynecology | Admitting: Obstetrics and Gynecology

## 2023-09-03 DIAGNOSIS — R109 Unspecified abdominal pain: Secondary | ICD-10-CM

## 2023-09-03 DIAGNOSIS — Z3490 Encounter for supervision of normal pregnancy, unspecified, unspecified trimester: Secondary | ICD-10-CM

## 2023-09-03 DIAGNOSIS — Z3201 Encounter for pregnancy test, result positive: Secondary | ICD-10-CM | POA: Diagnosis not present

## 2023-09-03 DIAGNOSIS — O26891 Other specified pregnancy related conditions, first trimester: Secondary | ICD-10-CM | POA: Diagnosis not present

## 2023-09-03 DIAGNOSIS — Z3A01 Less than 8 weeks gestation of pregnancy: Secondary | ICD-10-CM

## 2023-09-03 LAB — HCG, QUANTITATIVE, PREGNANCY: hCG, Beta Chain, Quant, S: 18481 m[IU]/mL — ABNORMAL HIGH (ref <5)

## 2023-09-03 LAB — CBC
HCT: 35.3 % — ABNORMAL LOW (ref 36.0–46.0)
Hemoglobin: 11 g/dL — ABNORMAL LOW (ref 12.0–15.0)
MCH: 24.9 pg — ABNORMAL LOW (ref 26.0–34.0)
MCHC: 31.2 g/dL (ref 30.0–36.0)
MCV: 79.9 fL — ABNORMAL LOW (ref 80.0–100.0)
Platelets: 457 10*3/uL — ABNORMAL HIGH (ref 150–400)
RBC: 4.42 MIL/uL (ref 3.87–5.11)
RDW: 15.9 % — ABNORMAL HIGH (ref 11.5–15.5)
WBC: 6.4 10*3/uL (ref 4.0–10.5)
nRBC: 0 % (ref 0.0–0.2)

## 2023-09-03 LAB — URINALYSIS, ROUTINE W REFLEX MICROSCOPIC
Bilirubin Urine: NEGATIVE
Glucose, UA: NEGATIVE mg/dL
Hgb urine dipstick: NEGATIVE
Ketones, ur: NEGATIVE mg/dL
Leukocytes,Ua: NEGATIVE
Nitrite: NEGATIVE
Protein, ur: NEGATIVE mg/dL
Specific Gravity, Urine: 1.02 (ref 1.005–1.030)
pH: 7.5 (ref 5.0–8.0)

## 2023-09-03 LAB — WET PREP, GENITAL
Clue Cells Wet Prep HPF POC: NONE SEEN
Sperm: NONE SEEN
Trich, Wet Prep: NONE SEEN
WBC, Wet Prep HPF POC: <10 (ref <10)
Yeast Wet Prep HPF POC: NONE SEEN

## 2023-09-03 LAB — POCT PREGNANCY, URINE: Preg Test, Ur: POSITIVE — AB

## 2023-09-03 MED ORDER — PREPLUS 27-1 MG PO TABS: 1.0000 | ORAL_TABLET | Freq: Every day | ORAL | 13 refills | Status: AC

## 2023-09-03 NOTE — MAU Note (Signed)
.  Natasha Kelly is a 23 y.o. at Unknown here in MAU reporting:Had a positive HPT last week 08/23/2023.  Stated she wanted to make sure baby was ok. Reports having cramping on and off for 2 moth. Deneis any vag bleeding or discharge.  LMP: 07/05/23 Onset of complaint: 1 month  Pain score: 5 Vitals:   09/03/23 1850  BP: 111/67  Pulse: 85  Resp: 18  Temp: 98 F (36.7 C)     FHT: n/a  Lab orders placed from triage: UPT

## 2023-09-03 NOTE — MAU Provider Note (Signed)
 Cramping     S [name] is a 23 y.o. G58P1010 pregnant female at Unknown who presents to MAU today with complaint of cramping intermittently for 2 months. Pt states +HPT 4/10.  Upreg + here in MAU.  Denies VB or d/c.  LMP 07/05/23.    Pertinent items noted in HPI and remainder of comprehensive ROS otherwise negative.   O BP 111/67   Pulse 85   Temp 98 F (36.7 C)   Resp 18   LMP 07/05/2023  Physical Exam Vitals and nursing note reviewed.  Constitutional:      General: She is not in acute distress.    Appearance: She is well-developed. She is not ill-appearing.  HENT:     Head: Normocephalic and atraumatic.     Mouth/Throat:     Mouth: Mucous membranes are moist.  Eyes:     Extraocular Movements: Extraocular movements intact.  Cardiovascular:     Rate and Rhythm: Normal rate.  Pulmonary:     Effort: Pulmonary effort is normal. No respiratory distress.  Abdominal:     General: Abdomen is flat. There is no distension.     Palpations: Abdomen is soft.     Tenderness: There is no abdominal tenderness.  Skin:    General: Skin is warm and dry.  Neurological:     Mental Status: She is alert and oriented to person, place, and time.     Motor: No weakness.  Psychiatric:        Mood and Affect: Mood normal.        Behavior: Behavior normal.      MDM: MAU Course: Upreg +  Pt informed that the ultrasound is considered a limited OB ultrasound and is not intended to be a complete ultrasound exam.  Patient also informed that the ultrasound is not being completed with the intent of assessing for fetal or placental anomalies or any pelvic abnormalities.  Explained that the purpose of today's ultrasound is to assess for  viability.  Patient acknowledges the purpose of the exam and the limitations of the study.    My interpretation: no IUP seen   hCG 18481 CBC Hgb 11.0, Plts 457 Previously noted to be A+, no rhogam  Wet prep negative GC collected  US  with singleton IUP  with cardiac activity, GA [redacted]w[redacted]d   AP #SIUP @ [redacted] weeks gestation   Discharge from MAU in stable condition with strict/usual precautions Follow up at desired OBGYN as scheduled for ongoing prenatal care  Allergies as of 09/03/2023   No Known Allergies      Medication List     STOP taking these medications    doxycycline  100 MG capsule Commonly known as: VIBRAMYCIN        TAKE these medications    PrePLUS 27-1 MG Tabs Take 1 tablet by mouth daily.   valACYclovir  1000 MG tablet Commonly known as: VALTREX  Take 1 tablet (1,000 mg total) by mouth 3 (three) times daily.        Ebony Goldstein, MD 09/03/2023 9:47 PM

## 2023-09-03 NOTE — Discharge Instructions (Signed)

## 2023-09-04 LAB — GC/CHLAMYDIA PROBE AMP (~~LOC~~) NOT AT ARMC
Chlamydia: NEGATIVE
Comment: NEGATIVE
Comment: NORMAL
Neisseria Gonorrhea: NEGATIVE

## 2023-10-09 ENCOUNTER — Telehealth

## 2023-10-09 DIAGNOSIS — O98519 Other viral diseases complicating pregnancy, unspecified trimester: Secondary | ICD-10-CM | POA: Diagnosis not present

## 2023-10-09 DIAGNOSIS — B009 Herpesviral infection, unspecified: Secondary | ICD-10-CM | POA: Diagnosis not present

## 2023-10-09 DIAGNOSIS — Z8632 Personal history of gestational diabetes: Secondary | ICD-10-CM | POA: Insufficient documentation

## 2023-10-09 DIAGNOSIS — Z3A11 11 weeks gestation of pregnancy: Secondary | ICD-10-CM

## 2023-10-09 DIAGNOSIS — Z3490 Encounter for supervision of normal pregnancy, unspecified, unspecified trimester: Secondary | ICD-10-CM | POA: Insufficient documentation

## 2023-10-09 MED ORDER — GOJJI WEIGHT SCALE MISC: 1.0000 | Freq: Every day | 0 refills | Status: AC

## 2023-10-09 MED ORDER — BLOOD PRESSURE KIT DEVI: 1.0000 | Freq: Every day | 0 refills | Status: AC

## 2023-10-09 NOTE — Progress Notes (Signed)
 New OB Intake  I connected with Natasha Kelly  on 10/09/23 at 10:15 AM EDT by MyChart Video Visit and verified that I am speaking with the correct person using two identifiers. Nurse is located at [facility] and pt is located at at work.  I discussed the limitations, risks, security and privacy concerns of performing an evaluation and management service by telephone and the availability of in person appointments. I also discussed with the patient that there may be a patient responsible charge related to this service. The patient expressed understanding and agreed to proceed.  I explained I am completing New OB Intake today. We discussed EDD of 04/30/2024, by Ultrasound. Pt is G3P1011. I reviewed her allergies, medications and Medical/Surgical/OB history.    Patient Active Problem List   Diagnosis Date Noted   Supervision of low-risk pregnancy 10/09/2023   HSV-2 infection complicating pregnancy, unspecified trimester 10/09/2023   History of gestational diabetes 10/09/2023     Concerns addressed today -Reminded pt of new OB appointment and Anatomy US  to be scheduled -Pt has not started PNV or Valtrex , she states she intends to pick up prescriptions  Delivery Plans Plans to deliver at [facility]. Discussed the nature of our practice with multiple providers including residents and students. Due to the size of the practice, the delivering provider may not be the same as those providing prenatal care.   Patient is not interested in water  birth.  MyChart/Babyscripts MyChart access verified. I explained pt will have some visits in office and some virtually. Babyscripts instructions given and order placed.  If patient is a candidate for Optimized scheduling, add to sticky note.   Blood Pressure Cuff/Weight Scale Blood pressure cuff ordered for patient to pick-up from [employer]. Explained after first prenatal appt pt will check weekly and document in Babyscripts. Patient does not have weight  scale; order sent to [facility], patient may track weight weekly in Babyscripts.  Anatomy US  Explained first scheduled US  will be around 19 weeks. Anatomy US  scheduled for 7/24/16m at 10:00am.  Is patient a CenteringPregnancy candidate?  Declined Declined due to Declined to say     Is patient a Mom+Baby Combined Care candidate?  Declined     Is patient a candidate for Babyscripts Optimization? Did not address, will address at new OB.   First visit review I reviewed new OB appt with patient. Explained pt will be seen by Dr. Ilona Malta at first visit 10/19/23. Discussed [facility] [place] genetic screening with patient. Pt desires Panorama and Horizon.. Routine prenatal labs needed.  Pt requested to end visit due to being at work before discussing baby scripts optimization.   Last Pap Diagnosis  Date Value Ref Range Status  02/20/2023   Final   - Negative for Intraepithelial Lesions or Malignancy (NILM)  02/20/2023 - Benign reactive/reparative changes  Final    Ninette Basque, RN 10/09/2023  5:25 PM

## 2023-10-09 NOTE — Patient Instructions (Addendum)

## 2023-10-19 ENCOUNTER — Encounter: Admitting: Family Medicine

## 2023-10-19 ENCOUNTER — Telehealth: Payer: Self-pay | Admitting: Family Medicine

## 2023-10-19 NOTE — Telephone Encounter (Signed)
 Called patient but there was no answer to get her rescheduled. Left VM to call back and get her rescheduled.

## 2023-11-06 NOTE — Progress Notes (Deleted)
 INITIAL PRENATAL VISIT  Subjective:   Natasha Kelly is being seen today for her first obstetrical visit.  This is not a planned pregnancy. This is a desired pregnancy.  She is at [redacted]w[redacted]d gestation by LMP/5  Her obstetrical history is significant for prior A1GDM. Relationship with FOB: {fob:16621}. Patient does intend to breast feed. Pregnancy history fully reviewed.  Patient reports no complaints.  Indications for ASA therapy (per uptodate) One of the following: Previous pregnancy with preeclampsia, especially early onset and with an adverse outcome No Multifetal gestation No Chronic hypertension No Type 1 or 2 diabetes mellitus No Chronic kidney disease No Autoimmune disease (antiphospholipid syndrome, systemic lupus erythematosus) No  Two or more of the following: Nulliparity No Obesity (body mass index >30 kg/m2) No Family history of preeclampsia in mother or sister No Age >=35 years No Sociodemographic characteristics (African American race, low socioeconomic level) Yes Personal risk factors (eg, previous pregnancy with low birth weight or small for gestational age infant, previous adverse pregnancy outcome [eg, stillbirth], interval >10 years between pregnancies) No  Early screening tests: A1C   Review of Systems:   Review of Systems  Objective:    Obstetric History OB History  Gravida Para Term Preterm AB Living  3 1 1  0 1 1  SAB IAB Ectopic Multiple Live Births  1 0 0 0 1    # Outcome Date GA Lbr Len/2nd Weight Sex Type Anes PTL Lv  3 Current           2 Term 04/27/22 [redacted]w[redacted]d  7 lb (3.175 kg) M Vag-Spont EPI  LIV  1 SAB 08/13/20 [redacted]w[redacted]d           Past Medical History:  Diagnosis Date   Anemia    Bacterial vaginosis    Chlamydia    Gonorrhea    Herpes    History of gestational diabetes 02/07/2022   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 1 each 0   Misc. Devices (GOJJI WEIGHT SCALE) MISC 1 Device by Does not apply route daily. 1 each 0   Prenatal Vit-Fe Fumarate-FA (PREPLUS) 27-1 MG TABS Take 1 tablet by mouth daily. (Patient not taking: Reported on 10/09/2023) 30 tablet 13   valACYclovir  (VALTREX ) 1000 MG tablet Take 1 tablet (1,000 mg total) by mouth 3 (three) times daily. (Patient not taking: Reported on 10/09/2023) 21 tablet 0   No current facility-administered medications on file prior to visit.    No Known Allergies  Social History:  reports that she has never smoked. She has never used smokeless tobacco. She reports that she does not drink alcohol and does not use drugs.  Family History  Problem Relation Age of Onset   Healthy Mother    Healthy Father    Asthma Sister    Cancer Maternal Grandmother    Diabetes Neg Hx    Heart disease Neg Hx    Hypertension Neg Hx    Stroke Neg Hx     The following portions of the patient's history were reviewed and updated as appropriate: allergies, current medications, past family history, past medical history, past social history,  past surgical history and problem list.  Review of Systems Review of Systems    Physical Exam:  LMP 07/05/2023  CONSTITUTIONAL: Well-developed, well-nourished female in no acute distress.  HENT:  Normocephalic, atraumatic.  Oropharynx is clear and moist EYES: Conjunctivae normal. No scleral icterus.  NECK: Normal range of motion, supple, no masses.  Normal thyroid.  SKIN: Skin is warm and dry. No rash noted. Not diaphoretic. No erythema. No pallor. MUSCULOSKELETAL: Normal range of motion. No tenderness.  No cyanosis, clubbing, or edema.   NEUROLOGIC: Alert and oriented to person, place, and time. Normal muscle tone  coordination.  PSYCHIATRIC: Normal mood and affect. Normal behavior. Normal judgment and thought content. CARDIOVASCULAR: Normal heart rate noted, regular rhythm RESPIRATORY: Clear to auscultation bilaterally. Effort and breath sounds normal, no problems with respiration noted. BREASTS: Symmetric in size. No masses, skin changes, nipple drainage, or lymphadenopathy. ABDOMEN: Soft, normal bowel sounds, no distention noted.  No tenderness, rebound or guarding. Fundal ht: *** PELVIC: Normal appearing external genitalia; normal appearing vaginal mucosa and cervix.  No abnormal discharge noted.  Pap smear ***obtained.  Normal uterine size, no other palpable masses, no uterine or adnexal tenderness.              Assessment:    Pregnancy: G3P1011  1. Encounter for supervision of low-risk pregnancy in second trimester (Primary)  2. HSV-2 infection complicating pregnancy, unspecified trimester PPX at 34 weeks  3. History of gestational diabetes Early HA1C  Consider early GTT-- offered today  4. [redacted] weeks gestation of pregnancy  5. Late prenatal care affecting pregnancy in second trimester Was scheduled for 6/6      Plan:     Initial labs drawn. Prenatal vitamins. Problem list reviewed and updated. Reviewed in detail the nature of the practice with collaborative care between  Genetic screening discussed: NIPS/ ordered. Role of ultrasound in pregnancy discussed; Anatomy US : ordered. Amniocentesis discussed: not indicated. Follow up in 4 weeks. Weight gain recommendations per IOM guidelines reviewed: underweight/BMI 18.5 or less > 28 - 40 lbs; normal weight/BMI 18.5 - 24.9 > 25 - 35 lbs; overweight/BMI 25 - 29.9 > 15 - 25 lbs; obese/BMI  30 or more > 11 - 20 lbs.  Discussed clinic routines, schedule of care and testing, genetic screening options, involvement of students and residents under the direct supervision of APPs and doctors and presence of female providers. Pt verbalized  understanding.   Natasha Suzen Octave, MD 11/06/2023 6:32 PM

## 2023-11-07 ENCOUNTER — Encounter: Admitting: Family Medicine

## 2023-11-07 DIAGNOSIS — B009 Herpesviral infection, unspecified: Secondary | ICD-10-CM

## 2023-11-07 DIAGNOSIS — Z3A15 15 weeks gestation of pregnancy: Secondary | ICD-10-CM

## 2023-11-07 DIAGNOSIS — Z8632 Personal history of gestational diabetes: Secondary | ICD-10-CM

## 2023-11-07 DIAGNOSIS — Z3492 Encounter for supervision of normal pregnancy, unspecified, second trimester: Secondary | ICD-10-CM

## 2023-11-07 DIAGNOSIS — O0932 Supervision of pregnancy with insufficient antenatal care, second trimester: Secondary | ICD-10-CM

## 2023-11-29 ENCOUNTER — Encounter: Admitting: Family Medicine

## 2023-11-29 NOTE — Progress Notes (Deleted)
 Subjective:   Natasha Kelly is a 23 y.o. G3P1011 at [redacted]w[redacted]d by early ultrasound being seen today for her first obstetrical visit.  Her obstetrical history is significant for {ob risk factors:10154}. Patient {does/does not:19097} intend to breast feed. Pregnancy history fully reviewed.  Patient reports {sx:14538}.  HISTORY: OB History  Gravida Para Term Preterm AB Living  3 1 1  0 1 1  SAB IAB Ectopic Multiple Live Births  1 0 0 0 1    # Outcome Date GA Lbr Len/2nd Weight Sex Type Anes PTL Lv  3 Current           2 Term 04/27/22 [redacted]w[redacted]d  7 lb (3.175 kg) M Vag-Spont EPI  LIV  1 SAB 08/13/20 [redacted]w[redacted]d            Last pap smear: Result Date Procedure Results Follow-ups  02/20/2023 Cytology - PAP( [place]) Adequacy: Satisfactory for evaluation; transformation zone component PRESENT. 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   Tinea versicolor 02/01/2022   Trichomonas infection    UTI (urinary tract infection)    Past Surgical History:  Procedure Laterality Date   NO PAST SURGERIES     Family History  Problem Relation Age of Onset   Healthy Mother    Healthy Father    Asthma Sister    Cancer Maternal Grandmother    Diabetes Neg Hx    Heart disease Neg Hx    Hypertension Neg Hx    Stroke Neg Hx    Social History   Tobacco Use   Smoking status: Never   Smokeless tobacco: Never  Vaping Use   Vaping status: Never Used  Substance Use Topics   Alcohol use: Never   Drug use: Never   No Known Allergies Current Outpatient Medications on File Prior to Visit  Medication Sig Dispense Refill   Blood Pressure Monitoring (BLOOD PRESSURE KIT) DEVI 1 Device by Does not apply route daily. 1 each 0   Misc.  Devices (GOJJI WEIGHT SCALE) MISC 1 Device by Does not apply route daily. 1 each 0   Prenatal Vit-Fe Fumarate-FA (PREPLUS) 27-1 MG TABS Take 1 tablet by mouth daily. (Patient not taking: Reported on 10/09/2023) 30 tablet 13   valACYclovir  (VALTREX ) 1000 MG tablet Take 1 tablet (1,000 mg total) by mouth 3 (three) times daily. (Patient not taking: Reported on 10/09/2023) 21 tablet 0   No current facility-administered medications on file prior to visit.     Exam   There were no vitals filed for this visit.    {New OB Pap vs No Pap Exam:26314}    Assessment:   Pregnancy: G3P1011 Patient Active Problem List   Diagnosis Date Noted   Supervision of low-risk pregnancy 10/09/2023   HSV-2 infection complicating pregnancy, unspecified trimester 10/09/2023   History of gestational diabetes 10/09/2023     Plan:  1. Encounter for supervision of low-risk pregnancy in second trimester (Primary) ***  2. History of gestational diabetes ***  3. HSV-2 infection complicating pregnancy, unspecified trimester ***   Initial labs drawn. Continue prenatal vitamins. Genetic Screening discussed, {Blank multiple:19196::First trimester screen,Quad screen,NIPS}: {requests/ordered/declines:14581}. Ultrasound discussed; fetal anatomic survey: {requests/ordered/declines:14581}. Problem list reviewed and updated. {CWH  vs MBD:26330}  Routine obstetric precautions reviewed. No follow-ups on file.    Donnice CHRISTELLA Carolus, MD/MPH Attending Family Medicine Physician, [facility] for [facility], [facility]

## 2023-12-02 ENCOUNTER — Encounter (HOSPITAL_COMMUNITY): Payer: Self-pay | Admitting: Obstetrics & Gynecology

## 2023-12-02 ENCOUNTER — Inpatient Hospital Stay (HOSPITAL_COMMUNITY)
Admission: AD | Admit: 2023-12-02 | Discharge: 2023-12-02 | Disposition: A | Discharge status: Home / Self Care | Attending: Obstetrics & Gynecology | Admitting: Obstetrics & Gynecology

## 2023-12-02 DIAGNOSIS — B9689 Other specified bacterial agents as the cause of diseases classified elsewhere: Secondary | ICD-10-CM | POA: Insufficient documentation

## 2023-12-02 DIAGNOSIS — Z711 Person with feared health complaint in whom no diagnosis is made: Secondary | ICD-10-CM

## 2023-12-02 DIAGNOSIS — R109 Unspecified abdominal pain: Secondary | ICD-10-CM | POA: Insufficient documentation

## 2023-12-02 DIAGNOSIS — O23592 Infection of other part of genital tract in pregnancy, second trimester: Secondary | ICD-10-CM | POA: Diagnosis not present

## 2023-12-02 DIAGNOSIS — O26892 Other specified pregnancy related conditions, second trimester: Secondary | ICD-10-CM | POA: Diagnosis present

## 2023-12-02 DIAGNOSIS — O26899 Other specified pregnancy related conditions, unspecified trimester: Secondary | ICD-10-CM

## 2023-12-02 DIAGNOSIS — Z3A18 18 weeks gestation of pregnancy: Secondary | ICD-10-CM | POA: Insufficient documentation

## 2023-12-02 LAB — URINALYSIS, ROUTINE W REFLEX MICROSCOPIC
Bilirubin Urine: NEGATIVE
Glucose, UA: NEGATIVE mg/dL
Hgb urine dipstick: NEGATIVE
Ketones, ur: NEGATIVE mg/dL
Nitrite: NEGATIVE
Protein, ur: 30 mg/dL — AB
Specific Gravity, Urine: 1.026 (ref 1.005–1.030)
pH: 6 (ref 5.0–8.0)

## 2023-12-02 LAB — WET PREP, GENITAL
Sperm: NONE SEEN
Trich, Wet Prep: NONE SEEN
WBC, Wet Prep HPF POC: >=10 — AB (ref <10)
Yeast Wet Prep HPF POC: NONE SEEN

## 2023-12-02 MED ORDER — METRONIDAZOLE 500 MG PO TABS: 500.0000 mg | ORAL_TABLET | Freq: Two times a day (BID) | ORAL | 0 refills | Status: DC

## 2023-12-02 NOTE — MAU Provider Note (Signed)
 History     CSN: [id number]  Arrival date and time: 12/02/23 1945   Event Date/Time   First Provider Initiated Contact with Patient 12/02/23 2255      Chief Complaint  Patient presents with   Abdominal Pain   HPI [name] is a 23 y.o. year old G83P1011 female at [redacted]w[redacted]d weeks gestation who presents to MAU reporting she has missed several OB appts and wanted to check to see if the baby is ok. She reports occasional lower abdominal cramping while at work today. She also reports white vaginal discharge. She receives [facility]; next appt is 12/26/2023.    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   Tinea versicolor 02/01/2022   Trichomonas infection    UTI (urinary tract infection)     Past Surgical History:  Procedure Laterality Date   NO PAST SURGERIES      Family History  Problem Relation Age of Onset   Healthy Mother    Healthy Father    Asthma Sister    Cancer Maternal Grandmother    Diabetes Neg Hx    Heart disease Neg Hx    Hypertension Neg Hx    Stroke Neg Hx     Social History   Tobacco Use   Smoking status: Never   Smokeless tobacco: Never  Vaping Use   Vaping status: Never Used  Substance Use Topics   Alcohol use: Never   Drug use: Never    Allergies: No Known Allergies  Medications Prior to Admission  Medication Sig Dispense Refill Last Dose/Taking   Blood Pressure Monitoring (BLOOD PRESSURE KIT) DEVI 1 Device by Does not apply route daily. 1 each 0    Misc. Devices (GOJJI WEIGHT SCALE) MISC 1 Device by Does not apply route daily. 1 each 0    Prenatal Vit-Fe Fumarate-FA (PREPLUS) 27-1 MG TABS Take 1 tablet by  mouth daily. (Patient not taking: Reported on 10/09/2023) 30 tablet 13    valACYclovir  (VALTREX ) 1000 MG tablet Take 1 tablet (1,000 mg total) by mouth 3 (three) times daily. (Patient not taking: Reported on 10/09/2023) 21 tablet 0     Review of Systems  Constitutional: Negative.   HENT: Negative.    Eyes: Negative.   Respiratory: Negative.    Cardiovascular: Negative.   Gastrointestinal: Negative.   Endocrine: Negative.   Genitourinary:  Positive for pelvic pain (cramping earlier while at work).  Musculoskeletal: Negative.   Skin: Negative.   Allergic/Immunologic: Negative.   Neurological: Negative.   Hematological: Negative.   Psychiatric/Behavioral: Negative.     Physical Exam   Blood pressure 107/65, pulse (!) 105, temperature 98.6 F (37 C), resp. rate 18, last menstrual period 07/05/2023, not currently breastfeeding.  Physical Exam Vitals and nursing note reviewed.  Constitutional:      Appearance: Normal appearance. She is normal weight.  Cardiovascular:     Rate  and Rhythm: Tachycardia present.     Pulses: Normal pulses.  Pulmonary:     Effort: Pulmonary effort is normal.  Musculoskeletal:        General: Normal range of motion.  Skin:    General: Skin is warm and dry.  Neurological:     Mental Status: She is alert and oriented to person, place, and time.  Psychiatric:        Mood and Affect: Mood normal.        Behavior: Behavior normal.        Thought Content: Thought content normal.        Judgment: Judgment normal.    FHTs by doppler: 145 bpm  MAU Course  Procedures  MDM CCUA Wet Prep GC/CT -- Results pending   Results for orders placed or performed during the hospital encounter of 12/02/23 (from the past 24 hours)  Urinalysis, Routine w reflex microscopic -Urine, Clean Catch     Status: Abnormal   Collection Time: 12/02/23  8:54 PM  Result Value Ref Range   Color, Urine YELLOW YELLOW   APPearance CLOUDY (A) CLEAR   Specific Gravity, Urine 1.026  1.005 - 1.030   pH 6.0 5.0 - 8.0   Glucose, UA NEGATIVE NEGATIVE mg/dL   Hgb urine dipstick NEGATIVE NEGATIVE   Bilirubin Urine NEGATIVE NEGATIVE   Ketones, ur NEGATIVE NEGATIVE mg/dL   Protein, ur 30 (A) NEGATIVE mg/dL   Nitrite NEGATIVE NEGATIVE   Leukocytes,Ua TRACE (A) NEGATIVE   RBC / HPF 0-5 0 - 5 RBC/hpf   WBC, UA 0-5 0 - 5 WBC/hpf   Bacteria, UA FEW (A) NONE SEEN   Squamous Epithelial / HPF 21-50 0 - 5 /HPF   Mucus PRESENT   Wet prep, genital     Status: Abnormal   Collection Time: 12/02/23  8:54 PM   Specimen: PATH Cytology Cervicovaginal Ancillary Only  Result Value Ref Range   Yeast Wet Prep HPF POC NONE SEEN NONE SEEN   Trich, Wet Prep NONE SEEN NONE SEEN   Clue Cells Wet Prep HPF POC PRESENT (A) NONE SEEN   WBC, Wet Prep HPF POC >=10 (A) <10   Sperm NONE SEEN     Assessment and Plan  1. Bacterial vaginosis (Primary) - Prescription for: Flagyl  500 mg po BID  2. Abdominal cramping affecting pregnancy - Advised abd cramping could be likely caused by BV - Advised to stay well-hydrated everyday  3. Worried well - Reassurance given that good FHTs were obtained today - Advised that emergency room is not appropriate place to seek care when she misses OB appts.   4. [redacted] weeks gestation of pregnancy   - .Discharge home - Keep scheduled appt with [facility] on 12/26/2023 - Patient verbalized an understanding of the plan of care and agrees.     Ala Cart, CNM 12/02/2023, 10:55 PM

## 2023-12-02 NOTE — MAU Note (Signed)
 Natasha Kelly is a 23 y.o. at [redacted]w[redacted]d here in MAU reporting: she has not been able to keep her Dr appointments and wants to check to see if baby is OK.  Reports she is having occasional  lower abd .had it at work today and has white  vag discharge.   LMP:  Onset of complaint: 2-3 weeks Pain score: 2-3 Vitals:   12/02/23 2050  BP: 107/65  Pulse: (!) 105  Resp: 18  Temp: 98.6 F (37 C)     FHT: 145  Lab orders placed from triage: u/a, vag swabs

## 2023-12-03 LAB — GC/CHLAMYDIA PROBE AMP (~~LOC~~) NOT AT ARMC
Chlamydia: NEGATIVE
Comment: NEGATIVE
Comment: NORMAL
Neisseria Gonorrhea: NEGATIVE

## 2023-12-06 ENCOUNTER — Ambulatory Visit

## 2023-12-06 DIAGNOSIS — Z3490 Encounter for supervision of normal pregnancy, unspecified, unspecified trimester: Secondary | ICD-10-CM

## 2023-12-06 DIAGNOSIS — Z8632 Personal history of gestational diabetes: Secondary | ICD-10-CM

## 2023-12-10 ENCOUNTER — Telehealth: Payer: Self-pay | Admitting: Family Medicine

## 2023-12-10 MED ORDER — VALACYCLOVIR HCL 500 MG PO TABS: 500.0000 mg | ORAL_TABLET | Freq: Two times a day (BID) | ORAL | 1 refills | Status: AC

## 2023-12-10 NOTE — Telephone Encounter (Signed)
 This patient is calling to see if she can get a medicine refilled for her that was prescribed at the hospital. The medicine is valACYclovir  (VALTREX ) she says she needs it for break outs.

## 2023-12-10 NOTE — Addendum Note (Signed)
 Addended by: ILEAN NORLEEN GAILS on: 12/10/2023 11:22 AM   Modules accepted: Orders

## 2023-12-26 ENCOUNTER — Other Ambulatory Visit

## 2023-12-26 ENCOUNTER — Ambulatory Visit: Admitting: Family Medicine

## 2023-12-26 ENCOUNTER — Other Ambulatory Visit (HOSPITAL_COMMUNITY)
Admission: RE | Admit: 2023-12-26 | Discharge: 2023-12-26 | Disposition: A | Discharge status: Home / Self Care | Source: Ambulatory Visit | Attending: Family Medicine | Admitting: Family Medicine

## 2023-12-26 ENCOUNTER — Other Ambulatory Visit: Payer: Self-pay

## 2023-12-26 VITALS — BP 107/69 | HR 99 | Wt 190.0 lb

## 2023-12-26 DIAGNOSIS — Z3492 Encounter for supervision of normal pregnancy, unspecified, second trimester: Secondary | ICD-10-CM

## 2023-12-26 DIAGNOSIS — Z113 Encounter for screening for infections with a predominantly sexual mode of transmission: Secondary | ICD-10-CM | POA: Diagnosis present

## 2023-12-26 DIAGNOSIS — Z3482 Encounter for supervision of other normal pregnancy, second trimester: Secondary | ICD-10-CM

## 2023-12-26 DIAGNOSIS — Z3A22 22 weeks gestation of pregnancy: Secondary | ICD-10-CM

## 2023-12-26 DIAGNOSIS — B009 Herpesviral infection, unspecified: Secondary | ICD-10-CM

## 2023-12-26 DIAGNOSIS — Z8632 Personal history of gestational diabetes: Secondary | ICD-10-CM

## 2023-12-26 DIAGNOSIS — O98512 Other viral diseases complicating pregnancy, second trimester: Secondary | ICD-10-CM

## 2023-12-26 MED ORDER — ASPIRIN 81 MG PO TBEC: 81.0000 mg | DELAYED_RELEASE_TABLET | Freq: Every day | ORAL | 12 refills | Status: DC

## 2023-12-26 MED ORDER — VALACYCLOVIR HCL 500 MG PO TABS: 500.0000 mg | ORAL_TABLET | Freq: Two times a day (BID) | ORAL | 3 refills | Status: DC

## 2023-12-26 NOTE — Progress Notes (Signed)
 Subjective:   Natasha Kelly is a 23 y.o. G3P1011 at [redacted]w[redacted]d by early ultrasound at 5 weeks 5 days being seen today for her first obstetrical visit.  Her obstetrical history is significant for history of gestational diabetes and previous pregnancy, HSV. Patient does not intend to breast feed. Pregnancy history fully reviewed.  Patient reports vaginal irritation.  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   Tinea versicolor 02/01/2022   Trichomonas infection    UTI (urinary tract infection)    Past Surgical History:  Procedure Laterality Date   NO PAST SURGERIES     Family History  Problem Relation Age of Onset   Healthy Mother    Healthy Father    Asthma Sister    Cancer Maternal Grandmother    Diabetes Neg Hx    Heart disease Neg Hx    Hypertension Neg Hx    Stroke Neg Hx    Social History   Tobacco Use   Smoking status: Never   Smokeless tobacco: Never  Vaping Use   Vaping status: Never Used  Substance Use Topics   Alcohol use: Never   Drug use: Never   No Known Allergies Current Outpatient Medications on File Prior to Visit  Medication Sig Dispense Refill   Blood Pressure Monitoring (BLOOD PRESSURE KIT) DEVI 1 Device by Does not apply route daily. 1 each 0   Misc. Devices (GOJJI WEIGHT SCALE) MISC 1 Device by Does not apply route daily. 1 each 0   Prenatal Vit-Fe Fumarate-FA (PREPLUS) 27-1 MG TABS Take 1 tablet by mouth daily. 30 tablet 13   metroNIDAZOLE  (FLAGYL ) 500 MG tablet  Take 1 tablet (500 mg total) by mouth 2 (two) times daily. (Patient not taking: Reported on 12/26/2023) 14 tablet 0   No current facility-administered medications on file prior to visit.     Exam   Vitals:   12/26/23 1439  BP: 107/69  Pulse: 99  Weight: 190 lb (86.2 kg)   Fetal Heart Rate (bpm): 140  System: General: well-developed, well-nourished female in no acute distress   Skin: normal coloration and turgor, no rashes   Neurologic: oriented, normal, negative, normal mood   Extremities: normal strength, tone, and muscle mass, ROM of all joints is normal   HEENT PERRLA, extraocular movement intact and sclera clear, anicteric   Mouth/Teeth mucous membranes moist, pharynx normal without lesions and dental hygiene good   Neck supple and no masses   Cardiovascular: regular rate and rhythm   Respiratory:  no respiratory distress, normal breath sounds   Abdomen: soft, non-tender; bowel sounds normal; no masses,  no organomegaly     Assessment:   Pregnancy: G3P1011 Patient Active Problem List   Diagnosis Date Noted   Supervision of low-risk pregnancy  10/09/2023   HSV-2 infection complicating pregnancy, unspecified trimester 10/09/2023   History of gestational diabetes 10/09/2023     Plan:  1. Encounter for supervision of other normal pregnancy in second trimester Fhr and BP appropriate today Patient will start on ASA daily for preeclampsia prevention due to African-American and BMI over 30. Patient is complaining about itchy discharge so will complete wet prep - PANORAMA PRENATAL TEST  2. HSV-2 infection complicating pregnancy, unspecified trimester Patient reports she is currently having an outbreak.  Requesting treatment for this.  Prescription sent for Valtrex  500 mg twice daily for 3 days. Patient will need prophylaxis starting at 34 weeks.  3. History of gestational diabetes Patient with GDM in her previous pregnancy.  Will need early GTT as soon as possible and  patient will schedule this.  If the results are normal she will need repeat at 28 weeks.  4. [redacted] weeks gestation of pregnancy (Primary) - Culture, OB Urine - CBC/D/Plt+RPR+Rh+ABO+RubIgG... - HgB A1c - Cervicovaginal ancillary only( Lenora)   Initial labs drawn. Continue prenatal vitamins. Genetic Screening discussed, NIPS: ordered. Ultrasound discussed; fetal anatomic survey: Previously scheduled. Problem list reviewed and updated. The nature of [facility] with multiple MDs and other Advanced Practice Providers was explained to patient; also emphasized that residents, students are part of our team. Routine obstetric precautions reviewed. No follow-ups on file.

## 2023-12-27 ENCOUNTER — Ambulatory Visit: Payer: Self-pay | Admitting: Family Medicine

## 2023-12-27 DIAGNOSIS — Z3492 Encounter for supervision of normal pregnancy, unspecified, second trimester: Secondary | ICD-10-CM

## 2023-12-27 DIAGNOSIS — O99012 Anemia complicating pregnancy, second trimester: Secondary | ICD-10-CM

## 2023-12-27 LAB — CBC/D/PLT+RPR+RH+ABO+RUBIGG...
Antibody Screen: NEGATIVE
Basophils Absolute: 0 x10E3/uL (ref 0.0–0.2)
Basos: 0 %
EOS (ABSOLUTE): 0.1 x10E3/uL (ref 0.0–0.4)
Eos: 1 %
HCV Ab: NONREACTIVE
HIV Screen 4th Generation wRfx: NONREACTIVE
Hematocrit: 32.7 % — ABNORMAL LOW (ref 34.0–46.6)
Hemoglobin: 10.1 g/dL — ABNORMAL LOW (ref 11.1–15.9)
Hepatitis B Surface Ag: NEGATIVE
Immature Grans (Abs): 0 x10E3/uL (ref 0.0–0.1)
Immature Granulocytes: 0 %
Lymphocytes Absolute: 2.1 x10E3/uL (ref 0.7–3.1)
Lymphs: 25 %
MCH: 25.4 pg — ABNORMAL LOW (ref 26.6–33.0)
MCHC: 30.9 g/dL — ABNORMAL LOW (ref 31.5–35.7)
MCV: 82 fL (ref 79–97)
Monocytes Absolute: 0.5 x10E3/uL (ref 0.1–0.9)
Monocytes: 6 %
Neutrophils Absolute: 5.6 x10E3/uL (ref 1.4–7.0)
Neutrophils: 68 %
Platelets: 375 x10E3/uL (ref 150–450)
RBC: 3.97 x10E6/uL (ref 3.77–5.28)
RDW: 15 % (ref 11.7–15.4)
RPR Ser Ql: NONREACTIVE
Rh Factor: POSITIVE
Rubella Antibodies, IGG: 3.03 {index} (ref >0.99)
WBC: 8.3 x10E3/uL (ref 3.4–10.8)

## 2023-12-27 LAB — HEMOGLOBIN A1C
Est. average glucose Bld gHb Est-mCnc: 103 mg/dL
Hgb A1c MFr Bld: 5.2 % (ref 4.8–5.6)

## 2023-12-27 LAB — HCV INTERPRETATION

## 2023-12-27 MED ORDER — FERROUS SULFATE 325 (65 FE) MG PO TBEC: 325.0000 mg | DELAYED_RELEASE_TABLET | ORAL | 2 refills | Status: DC

## 2023-12-28 LAB — CERVICOVAGINAL ANCILLARY ONLY
Bacterial Vaginitis (gardnerella): NEGATIVE
Candida Glabrata: NEGATIVE
Candida Vaginitis: POSITIVE — AB
Chlamydia: NEGATIVE
Comment: NEGATIVE
Comment: NEGATIVE
Comment: NEGATIVE
Comment: NEGATIVE
Comment: NEGATIVE
Comment: NORMAL
Neisseria Gonorrhea: NEGATIVE
Trichomonas: NEGATIVE

## 2024-01-01 LAB — PANORAMA PRENATAL TEST FULL PANEL:PANORAMA TEST PLUS 5 ADDITIONAL MICRODELETIONS: FETAL FRACTION: 6.4

## 2024-01-01 MED ORDER — FLUCONAZOLE 150 MG PO TABS: 150.0000 mg | ORAL_TABLET | Freq: Once | ORAL | 0 refills | Status: AC

## 2024-01-25 ENCOUNTER — Encounter: Admitting: Family Medicine

## 2024-01-28 ENCOUNTER — Other Ambulatory Visit

## 2024-01-28 ENCOUNTER — Ambulatory Visit (INDEPENDENT_AMBULATORY_CARE_PROVIDER_SITE_OTHER): Admitting: Family Medicine

## 2024-01-28 VITALS — BP 121/75 | HR 76 | Wt 167.8 lb

## 2024-01-28 DIAGNOSIS — Z8632 Personal history of gestational diabetes: Secondary | ICD-10-CM

## 2024-01-28 DIAGNOSIS — B009 Herpesviral infection, unspecified: Secondary | ICD-10-CM

## 2024-01-28 DIAGNOSIS — Z3492 Encounter for supervision of normal pregnancy, unspecified, second trimester: Secondary | ICD-10-CM

## 2024-01-28 DIAGNOSIS — Z3A26 26 weeks gestation of pregnancy: Secondary | ICD-10-CM

## 2024-01-28 DIAGNOSIS — O98512 Other viral diseases complicating pregnancy, second trimester: Secondary | ICD-10-CM

## 2024-01-28 NOTE — Progress Notes (Signed)
   PRENATAL VISIT NOTE  Subjective:  Natasha Kelly is a 23 y.o. G3P1011 at [redacted]w[redacted]d being seen today for ongoing prenatal care.  She is currently monitored for the following issues for this low-risk pregnancy and has Anemia complicating pregnancy, second trimester; Supervision of low-risk pregnancy; HSV-2 infection complicating pregnancy, unspecified trimester; and History of gestational diabetes on their problem list.  Patient reports no bleeding, no contractions, no cramping, and no leaking.  Contractions: Not present. Vag. Bleeding: None.  Movement: Present. Denies leaking of fluid.   The following portions of the patient's history were reviewed and updated as appropriate: allergies, current medications, past family history, past medical history, past social history, past surgical history and problem list.   Objective:    Vitals:   01/28/24 0831  BP: 121/75  Pulse: 76  Weight: 167 lb 12.8 oz (76.1 kg)    Fetal Status:  Fetal Heart Rate (bpm): 150   Movement: Present    General: Alert, oriented and cooperative. Patient is in no acute distress.  Skin: Skin is warm and dry. No rash noted.   Cardiovascular: Normal heart rate noted  Respiratory: Normal respiratory effort, no problems with respiration noted  Abdomen: Soft, gravid, appropriate for gestational age.  Pain/Pressure: Absent     Pelvic: Cervical exam deferred        Extremities: Normal range of motion.  Edema: None  Mental Status: Normal mood and affect. Normal behavior. Normal judgment and thought content.   Assessment and Plan:  Pregnancy: G3P1011 at [redacted]w[redacted]d 1. Encounter for supervision of low-risk pregnancy in second trimester (Primary) FHR BP appropriate today History of iron  deficiency anemia on iron  tablets.  Will reevaluate hemoglobin at next visit to determine need for iron  infusions  2. History of gestational diabetes Was supposed to have early GTT but for some reason did not get scheduled. GTT next week  3. HSV-2  infection complicating pregnancy, unspecified trimester Will need Valtrex  prophylaxis starting between 34 and 36 weeks  4. [redacted] weeks gestation of pregnancy   Preterm labor symptoms and general obstetric precautions including but not limited to vaginal bleeding, contractions, leaking of fluid and fetal movement were reviewed in detail with the patient. Please refer to After Visit Summary for other counseling recommendations.   No follow-ups on file.  Future Appointments  Date Time Provider Department Center  02/06/2024  9:20 AM [facility] LAB [facility]  02/11/2024  8:15 AM Ilean Norleen GAILS, MD [facility]  02/13/2024  7:00 AM [facility] PROVIDER 1 [facility]  02/13/2024  7:30 AM [facility] US4 [facility]  02/25/2024  8:55 AM Lola Donnice HERO, MD [facility]  03/10/2024  8:35 AM Ilean, Norleen GAILS, MD [facility]    Norleen GAILS Ilean, MD

## 2024-01-30 ENCOUNTER — Other Ambulatory Visit: Payer: Self-pay

## 2024-01-30 DIAGNOSIS — Z3A27 27 weeks gestation of pregnancy: Secondary | ICD-10-CM

## 2024-02-06 ENCOUNTER — Ambulatory Visit (INDEPENDENT_AMBULATORY_CARE_PROVIDER_SITE_OTHER)

## 2024-02-06 ENCOUNTER — Encounter (HOSPITAL_COMMUNITY): Payer: Self-pay | Admitting: Obstetrics and Gynecology

## 2024-02-06 ENCOUNTER — Other Ambulatory Visit

## 2024-02-06 ENCOUNTER — Inpatient Hospital Stay (HOSPITAL_COMMUNITY)
Admission: AD | Admit: 2024-02-06 | Discharge: 2024-02-06 | Disposition: A | Discharge status: Home / Self Care | Attending: Obstetrics and Gynecology | Admitting: Obstetrics and Gynecology

## 2024-02-06 ENCOUNTER — Other Ambulatory Visit: Payer: Self-pay

## 2024-02-06 ENCOUNTER — Ambulatory Visit: Payer: Self-pay

## 2024-02-06 VITALS — BP 111/64 | HR 104 | Wt 172.1 lb

## 2024-02-06 DIAGNOSIS — Z3A27 27 weeks gestation of pregnancy: Secondary | ICD-10-CM

## 2024-02-06 DIAGNOSIS — Z3689 Encounter for other specified antenatal screening: Secondary | ICD-10-CM | POA: Diagnosis not present

## 2024-02-06 DIAGNOSIS — O26893 Other specified pregnancy related conditions, third trimester: Secondary | ICD-10-CM | POA: Diagnosis not present

## 2024-02-06 DIAGNOSIS — Z3492 Encounter for supervision of normal pregnancy, unspecified, second trimester: Secondary | ICD-10-CM

## 2024-02-06 DIAGNOSIS — Z3A28 28 weeks gestation of pregnancy: Secondary | ICD-10-CM | POA: Diagnosis not present

## 2024-02-06 DIAGNOSIS — B009 Herpesviral infection, unspecified: Secondary | ICD-10-CM

## 2024-02-06 DIAGNOSIS — N898 Other specified noninflammatory disorders of vagina: Secondary | ICD-10-CM | POA: Diagnosis not present

## 2024-02-06 DIAGNOSIS — Z113 Encounter for screening for infections with a predominantly sexual mode of transmission: Secondary | ICD-10-CM | POA: Diagnosis present

## 2024-02-06 DIAGNOSIS — O98513 Other viral diseases complicating pregnancy, third trimester: Secondary | ICD-10-CM

## 2024-02-06 DIAGNOSIS — Z0371 Encounter for suspected problem with amniotic cavity and membrane ruled out: Secondary | ICD-10-CM | POA: Diagnosis present

## 2024-02-06 LAB — POCT FERN TEST: POCT Fern Test: NEGATIVE

## 2024-02-06 LAB — WET PREP, GENITAL
Clue Cells Wet Prep HPF POC: NONE SEEN
Sperm: NONE SEEN
Trich, Wet Prep: NONE SEEN
WBC, Wet Prep HPF POC: <10 (ref <10)
Yeast Wet Prep HPF POC: NONE SEEN

## 2024-02-06 LAB — RUPTURE OF MEMBRANE (ROM)PLUS: Rom Plus: NEGATIVE

## 2024-02-06 NOTE — MAU Note (Signed)
 Patient reports she's been having watery discharge for 2 weeks. Sent from office here.

## 2024-02-06 NOTE — MAU Note (Signed)
 This RN assumed care of the patient while primary RN on break.

## 2024-02-06 NOTE — MAU Note (Signed)
 Natasha Kelly is a 23 y.o. at [redacted]w[redacted]d here in MAU reporting: sent from office for c/o watery vaginal discharge that began 2 weeks ago.  Denies VB.  Reports +FM.  LMP:  Onset of complaint: 2 weeks ago Pain score: 0 Vitals:   02/06/24 1250  BP: 115/66  Pulse: (!) 106  Resp: 17  Temp: 98 F (36.7 C)  SpO2: 100%     FHT: 145 bpm  Lab orders placed from triage: None

## 2024-02-06 NOTE — Progress Notes (Signed)
 Natasha Kelly is here today for self-swab because she has been experiencing thick white discharge, but also there has been watery fluid present. Patient states these symptoms started two weeks ago. Patient states she can't determine if she's leaking fluid or if she has discharge because she is having to change underwear frequently. Discussed chart review with Physician who advised patient to be check at MAU. Patient verbalized she will go to MAU.   Devon, RN

## 2024-02-06 NOTE — MAU Provider Note (Addendum)
 History     CSN: [id number]  Arrival date and time: 02/06/24 1234    Chief Complaint  Patient presents with   Rupture of Membranes   HPI Natasha Kelly is a 23 y.o. G3P1011 at [redacted]w[redacted]d who presents to MAU complaining of clear-to-white vaginal discharge for 2 weeks. She denies vaginal itching, pain, contractions, or vaginal bleeding. She reports she has no other associated symptoms. She endorses +FM. She states she has history of recurrent yeast infections, and these symptoms mimic previous instances.   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   Tinea versicolor 02/01/2022   Trichomonas infection    UTI (urinary tract infection)     Past Surgical History:  Procedure Laterality Date   NO PAST SURGERIES      Family History  Problem Relation Age of Onset   Healthy Mother    Healthy Father    Asthma Sister    Cancer Maternal Grandmother    Diabetes Neg Hx    Heart disease Neg Hx    Hypertension Neg Hx    Stroke Neg Hx     Social History   Tobacco Use   Smoking status: Never   Smokeless tobacco: Never  Vaping Use   Vaping status: Never Used  Substance Use Topics   Alcohol use: Never   Drug use: Never    Allergies: No Known Allergies  Medications Prior to Admission  Medication Sig Dispense Refill Last Dose/Taking   aspirin  EC 81 MG tablet Take 1 tablet (81 mg total) by mouth daily. Swallow whole. (Patient not taking: Reported on 02/06/2024) 30 tablet 12    Blood Pressure Monitoring (BLOOD PRESSURE KIT) DEVI 1 Device by Does not apply route daily. 1 each 0    ferrous sulfate  325 (65 FE) MG EC tablet Take 1 tablet (325 mg total) by mouth every  other day. 30 tablet 2    metroNIDAZOLE  (FLAGYL ) 500 MG tablet Take 1 tablet (500 mg total) by mouth 2 (two) times daily. (Patient not taking: Reported on 02/06/2024) 14 tablet 0    Misc. Devices (GOJJI WEIGHT SCALE) MISC 1 Device by Does not apply route daily. 1 each 0    Prenatal Vit-Fe Fumarate-FA (PREPLUS) 27-1 MG TABS Take 1 tablet by mouth daily. (Patient not taking: Reported on 02/06/2024) 30 tablet 13    valACYclovir  (VALTREX ) 500 MG tablet Take 1 tablet (500 mg total) by mouth 2 (two) times daily. (Patient not taking: Reported on 02/06/2024) 6 tablet 3     Review of Systems  Gastrointestinal:  Negative for abdominal pain.  Genitourinary:  Positive for vaginal discharge.   Physical Exam   Blood pressure 115/66, pulse (!) 106, temperature 98 F (36.7 C), temperature source Oral, resp. rate 17, height 5' 3 (1.6 m), weight 78.6 kg, last menstrual period 07/05/2023, SpO2 100%, not currently breastfeeding.  Physical Exam Vitals reviewed.  Constitutional:  Appearance: Normal appearance.  HENT:     Head: Normocephalic.     Mouth/Throat:     Mouth: Mucous membranes are moist.  Eyes:     Extraocular Movements: Extraocular movements intact.  Cardiovascular:     Rate and Rhythm: Regular rhythm. Tachycardia present.     Pulses: Normal pulses.     Heart sounds: Normal heart sounds.  Pulmonary:     Effort: Pulmonary effort is normal.     Breath sounds: Normal breath sounds.  Skin:    General: Skin is warm and dry.     Capillary Refill: Capillary refill takes less than 2 seconds.  Neurological:     Mental Status: She is alert and oriented to person, place, and time.  Psychiatric:        Mood and Affect: Mood normal.        Behavior: Behavior normal.    MAU Course  Procedures  MDM - Fern test - Wet prep - GC/Chlamydia - ROM plus  - Differential diagnosis for this patient includes but is not limited to: vaginal candidiasis, bacterial vaginosis, and STI.   Assessment and  Plan  Natasha Kelly is a 23 y.o. G3P1011 at [redacted]w[redacted]d who presents to MAU complaining of clear-to-white vaginal discharge for 2 weeks.   [place] Distance test - negative - Wet prep - pending at discharge - GC/Chlamydia - pending at discharge - ROM plus - pending at discharge  Plan - No evidence of ROM. At patient request, discharge home in stable condition. Follow-up with primary OB as scheduled. Pending results will be sent to patient via MyChart. Will contact patient if positive results.   Natasha Kelly 02/06/2024, 1:39 PM   Attestation of Supervision of Student:  I confirm that I have verified the information documented in the nurse practitioner student's note and that I have also personally reperformed the history, physical exam and all medical decision making activities.  I have verified that all services and findings are accurately documented in this student's note; and I agree with management and plan as outlined in the documentation. I have also made any necessary editorial changes.  Patient reports she was at the office and c/o vaginal discharge.  She feels she has a yeast infection, but also notes that their is watery fluid. However, patient does not feel her water  is broken. Patient endorses fetal movement and denies abdominal cramping/contractions. Patient requesting discharge stating her partner has to be at work.   Exam: General: Appears Well, No Distress. Chest: No respiratory distress.   Abd: Gravid Appears AGA Pelvic: NEFG. WP, GC/CT, and ROM plus collected blindly. BME deferred  FHT: 145 bpm, Mod Var, -Decels, +10x10 Accels No ctx graphed  -Distance returns negative.  -Exam performed.  -Informed that cultures will be sent and she does not have to wait for results. -However, if rom plus returns positive patient will need to return for further evaluation and possible admission.   -Patient verbalizes understanding and agreeable to returning if needed. [name], RN present and witnesses  interaction. -Informed that if cultures abnormal results and treatment to be sent via mychart.  -Precautions reviewed. -Discharge to home in stable condition.    Natasha Kelly, CNM [facility] [employer], [facility] 02/06/2024 1:55 PM

## 2024-02-07 ENCOUNTER — Ambulatory Visit: Payer: Self-pay | Admitting: Family Medicine

## 2024-02-07 LAB — HIV ANTIBODY (ROUTINE TESTING W REFLEX): HIV Screen 4th Generation wRfx: NONREACTIVE

## 2024-02-07 LAB — GLUCOSE TOLERANCE, 2 HOURS W/ 1HR
Glucose, 1 hour: 83 mg/dL (ref 70–179)
Glucose, 2 hour: 99 mg/dL (ref 70–152)
Glucose, Fasting: 68 mg/dL — ABNORMAL LOW (ref 70–91)

## 2024-02-07 LAB — CBC
Hematocrit: 29.4 % — ABNORMAL LOW (ref 34.0–46.6)
Hemoglobin: 8.9 g/dL — ABNORMAL LOW (ref 11.1–15.9)
MCH: 24.7 pg — ABNORMAL LOW (ref 26.6–33.0)
MCHC: 30.3 g/dL — ABNORMAL LOW (ref 31.5–35.7)
MCV: 81 fL (ref 79–97)
Platelets: 316 x10E3/uL (ref 150–450)
RBC: 3.61 x10E6/uL — ABNORMAL LOW (ref 3.77–5.28)
RDW: 14.6 % (ref 11.7–15.4)
WBC: 6.7 x10E3/uL (ref 3.4–10.8)

## 2024-02-07 LAB — GC/CHLAMYDIA PROBE AMP (~~LOC~~) NOT AT ARMC
Chlamydia: NEGATIVE
Comment: NEGATIVE
Comment: NORMAL
Neisseria Gonorrhea: NEGATIVE

## 2024-02-07 LAB — RPR: RPR Ser Ql: NONREACTIVE

## 2024-02-08 ENCOUNTER — Telehealth: Payer: Self-pay

## 2024-02-08 NOTE — Telephone Encounter (Signed)
 Dr. Eldonna, patient will be scheduled as soon as possible.  Auth Submission: NO AUTH NEEDED Site of care: Site of care: [facility] Payer: [place] medicaid Medication & CPT/J Code(s) submitted: Venofer  (Iron  Sucrose) J1756 Diagnosis Code:  Route of submission (phone, fax, portal):  Phone # Fax # Auth type: Buy/Bill PB Units/visits requested: 200mg  x 5 doses Reference number:  Approval from: 02/08/24 to 05/14/24

## 2024-02-11 ENCOUNTER — Other Ambulatory Visit: Payer: Self-pay

## 2024-02-11 ENCOUNTER — Ambulatory Visit: Admitting: Family Medicine

## 2024-02-11 VITALS — BP 109/67 | HR 107 | Wt 172.6 lb

## 2024-02-11 DIAGNOSIS — O99012 Anemia complicating pregnancy, second trimester: Secondary | ICD-10-CM

## 2024-02-11 DIAGNOSIS — O98513 Other viral diseases complicating pregnancy, third trimester: Secondary | ICD-10-CM

## 2024-02-11 DIAGNOSIS — Z3A28 28 weeks gestation of pregnancy: Secondary | ICD-10-CM

## 2024-02-11 DIAGNOSIS — O99013 Anemia complicating pregnancy, third trimester: Secondary | ICD-10-CM

## 2024-02-11 DIAGNOSIS — Z8632 Personal history of gestational diabetes: Secondary | ICD-10-CM

## 2024-02-11 DIAGNOSIS — Z3492 Encounter for supervision of normal pregnancy, unspecified, second trimester: Secondary | ICD-10-CM

## 2024-02-11 DIAGNOSIS — B009 Herpesviral infection, unspecified: Secondary | ICD-10-CM

## 2024-02-11 NOTE — Progress Notes (Signed)
   PRENATAL VISIT NOTE  Subjective:  Natasha Kelly is a 23 y.o. G3P1011 at [redacted]w[redacted]d being seen today for ongoing prenatal care.  She is currently monitored for the following issues for this low-risk pregnancy and has Anemia complicating pregnancy, second trimester; Supervision of low-risk pregnancy; HSV-2 infection complicating pregnancy, unspecified trimester; and History of gestational diabetes on their problem list.  Patient reports no bleeding, no contractions, no cramping, and no leaking.  Contractions: Irritability. Vag. Bleeding: None.  Movement: Present. Denies leaking of fluid.   The following portions of the patient's history were reviewed and updated as appropriate: allergies, current medications, past family history, past medical history, past social history, past surgical history and problem list.   Objective:    Vitals:   02/11/24 0832  BP: 109/67  Pulse: (!) 107  Weight: 172 lb 9.6 oz (78.3 kg)    Fetal Status:  Fetal Heart Rate (bpm): 154   Movement: Present    General: Alert, oriented and cooperative. Patient is in no acute distress.  Skin: Skin is warm and dry. No rash noted.   Cardiovascular: Normal heart rate noted  Respiratory: Normal respiratory effort, no problems with respiration noted  Abdomen: Soft, gravid, appropriate for gestational age.  Pain/Pressure: Absent     Pelvic: Cervical exam deferred        Extremities: Normal range of motion.  Edema: None  Mental Status: Normal mood and affect. Normal behavior. Normal judgment and thought content.   Assessment and Plan:  Pregnancy: G3P1011 at [redacted]w[redacted]d 1. Encounter for supervision of low-risk pregnancy in second trimester (Primary) FHR BP appropriate today Patient missed previous anatomy ultrasound.  Scheduled for 10/1.  2. History of gestational diabetes GTT this pregnancy within normal limits  3. Anemia complicating pregnancy, second trimester Most recent hemoglobin 8.9 Set up for iron  infusions  4. HSV-2  infection complicating pregnancy, unspecified trimester Will need Valtrex  prophylaxis starting between 34 and 36 weeks  5. [redacted] weeks gestation of pregnancy   Preterm labor symptoms and general obstetric precautions including but not limited to vaginal bleeding, contractions, leaking of fluid and fetal movement were reviewed in detail with the patient. Please refer to After Visit Summary for other counseling recommendations.   No follow-ups on file.  Future Appointments  Date Time Provider Department Center  02/13/2024  7:00 AM [facility] PROVIDER 1 [facility]  02/13/2024  7:30 AM [facility] US4 [facility]  02/18/2024 10:15 AM [facility]CHAIR 7 [facility] None  02/20/2024  9:15 AM [facility]CHAIR 7 [facility] None  02/25/2024  8:55 AM Lola Donnice HERO, MD [facility]  02/25/2024 10:15 AM [facility]CHAIR 2 [facility] None  02/27/2024  8:30 AM [facility]CHAIR 5 [facility] None  03/03/2024  8:30 AM [facility]CHAIR 5 [facility] None  03/10/2024  8:35 AM Boy Delamater, Norleen GAILS, MD [facility]    Norleen GAILS Rover, MD

## 2024-02-13 ENCOUNTER — Ambulatory Visit (HOSPITAL_BASED_OUTPATIENT_CLINIC_OR_DEPARTMENT_OTHER): Admitting: Obstetrics and Gynecology

## 2024-02-13 ENCOUNTER — Ambulatory Visit: Payer: Self-pay | Admitting: Family Medicine

## 2024-02-13 ENCOUNTER — Ambulatory Visit: Attending: Family Medicine

## 2024-02-13 VITALS — BP 115/58 | HR 96

## 2024-02-13 DIAGNOSIS — O98513 Other viral diseases complicating pregnancy, third trimester: Secondary | ICD-10-CM | POA: Diagnosis not present

## 2024-02-13 DIAGNOSIS — Z3492 Encounter for supervision of normal pregnancy, unspecified, second trimester: Secondary | ICD-10-CM | POA: Insufficient documentation

## 2024-02-13 DIAGNOSIS — D649 Anemia, unspecified: Secondary | ICD-10-CM

## 2024-02-13 DIAGNOSIS — O99013 Anemia complicating pregnancy, third trimester: Secondary | ICD-10-CM | POA: Diagnosis not present

## 2024-02-13 DIAGNOSIS — Z8632 Personal history of gestational diabetes: Secondary | ICD-10-CM | POA: Diagnosis not present

## 2024-02-13 DIAGNOSIS — B009 Herpesviral infection, unspecified: Secondary | ICD-10-CM

## 2024-02-13 DIAGNOSIS — Z3A29 29 weeks gestation of pregnancy: Secondary | ICD-10-CM | POA: Diagnosis not present

## 2024-02-13 DIAGNOSIS — O09893 Supervision of other high risk pregnancies, third trimester: Secondary | ICD-10-CM | POA: Insufficient documentation

## 2024-02-13 DIAGNOSIS — Z3490 Encounter for supervision of normal pregnancy, unspecified, unspecified trimester: Secondary | ICD-10-CM | POA: Diagnosis present

## 2024-02-13 DIAGNOSIS — Z8619 Personal history of other infectious and parasitic diseases: Secondary | ICD-10-CM | POA: Insufficient documentation

## 2024-02-13 DIAGNOSIS — Z3493 Encounter for supervision of normal pregnancy, unspecified, third trimester: Secondary | ICD-10-CM

## 2024-02-13 NOTE — Progress Notes (Signed)
 Maternal-Fetal Medicine Consultation  Name: Natasha Kelly  MRN: [id number]  GA: [id number] [redacted]w[redacted]d   Patient is here for fetal anatomy scan.  Her pregnancy is dated by first trimester ultrasound. On cell-free fetal DNA screening, the risks of fetal aneuploidies are not increased. Obstetrical history is significant for a term vaginal delivery in December 2023 of a female infant weighing 7 pounds and 3 ounces at birth.  Her pregnancy was complicated by gestational diabetes that was well-controlled on diet. Patient has anemia and will be starting iron  infusions this week.  Recent hemoglobin was 8.9%. She does not have gestational diabetes in this pregnancy.  Blood pressure today at our office is 115/58 mmHg. Patient had genital herpes infection a year ago.  Ultrasound Normal fetal growth and amniotic fluid. No obvious fetal structural defects are seen.  Fetal anatomical survey is limited because of advanced gestational age.  Short Interpregnancy Interval It is defined as the time interval (18 to 24 months) between the end of previous pregnancy and the beginning of next pregnancy. The impact of short pregnancy interval on the outcome of subsequent pregnancy is uncertain. Some studies have shown that congenital anomalies, preterm delivery, and fetal growth restriction rates are increased in pregnancies with short interpregnancy interval.  However, it is not supported by other reports. Overall, we should expect good pregnancy outcomes if there are no other high-risk factors.  Anemia in pregnancy I discussed the importance of iron  transfusion that takes time to build up her hemoglobin before delivery.  I encouraged her to keep her appointments.  Genital herpes suppression Patient was counseled that she if she has another outbreak at delivery, cesarean section will be indicated.  I encouraged her to take suppression therapy from [redacted] weeks gestation to prevent genital herpes  infection.  Recommendations -Follow-up scans as clinically indicated.   Consultation including face-to-face (more than 50%) counseling 30 minutes.

## 2024-02-18 ENCOUNTER — Ambulatory Visit (INDEPENDENT_AMBULATORY_CARE_PROVIDER_SITE_OTHER)

## 2024-02-18 VITALS — BP 111/73 | HR 94 | Temp 98.4°F | Resp 20 | Ht 63.0 in | Wt 174.0 lb

## 2024-02-18 DIAGNOSIS — Z3A29 29 weeks gestation of pregnancy: Secondary | ICD-10-CM

## 2024-02-18 DIAGNOSIS — D649 Anemia, unspecified: Secondary | ICD-10-CM

## 2024-02-18 DIAGNOSIS — O99013 Anemia complicating pregnancy, third trimester: Secondary | ICD-10-CM

## 2024-02-18 DIAGNOSIS — O99012 Anemia complicating pregnancy, second trimester: Secondary | ICD-10-CM

## 2024-02-18 MED ORDER — IRON SUCROSE 20 MG/ML IV SOLN
200.0000 mg | Freq: Once | INTRAVENOUS | Status: AC
  Administered 2024-02-18: 200 mg via INTRAVENOUS
  Filled 2024-02-18: qty 10

## 2024-02-18 NOTE — Progress Notes (Signed)
 Diagnosis: Iron Deficiency Anemia  Provider:  Chilton Greathouse MD  Procedure: IV Push  IV Type: Peripheral, IV Location: L Forearm  Venofer (Iron Sucrose), Dose: 200 mg  Post Infusion IV Care: Observation period completed and Peripheral IV Discontinued  Discharge: Condition: Good, Destination: Home . AVS Declined  Performed by:  Garnette Czech, RN

## 2024-02-20 ENCOUNTER — Ambulatory Visit

## 2024-02-20 VITALS — BP 107/71 | HR 98 | Temp 99.1°F | Resp 18 | Ht 63.0 in | Wt 173.2 lb

## 2024-02-20 DIAGNOSIS — O99012 Anemia complicating pregnancy, second trimester: Secondary | ICD-10-CM

## 2024-02-20 DIAGNOSIS — O99013 Anemia complicating pregnancy, third trimester: Secondary | ICD-10-CM

## 2024-02-20 DIAGNOSIS — D649 Anemia, unspecified: Secondary | ICD-10-CM | POA: Diagnosis not present

## 2024-02-20 DIAGNOSIS — Z3A30 30 weeks gestation of pregnancy: Secondary | ICD-10-CM | POA: Diagnosis not present

## 2024-02-20 MED ORDER — IRON SUCROSE 20 MG/ML IV SOLN
200.0000 mg | Freq: Once | INTRAVENOUS | Status: AC
  Administered 2024-02-20: 200 mg via INTRAVENOUS
  Filled 2024-02-20: qty 10

## 2024-02-20 NOTE — Progress Notes (Signed)
 Diagnosis: Acute Anemia  Provider:  Praveen Mannam MD  Procedure: IV Push  IV Type: Peripheral, IV Location: R Antecubital  Venofer  (Iron  Sucrose), Dose: 200 mg  Post Infusion IV Care: Observation period completed  Discharge: Condition: Good, Destination: Home . AVS Declined  Performed by:  Rachelle Bue, RN

## 2024-02-25 ENCOUNTER — Other Ambulatory Visit: Payer: Self-pay

## 2024-02-25 ENCOUNTER — Ambulatory Visit

## 2024-02-25 ENCOUNTER — Ambulatory Visit: Admitting: Family Medicine

## 2024-02-25 ENCOUNTER — Encounter: Payer: Self-pay | Admitting: Family Medicine

## 2024-02-25 VITALS — BP 108/70 | HR 90 | Temp 97.7°F | Resp 18 | Ht 63.0 in | Wt 174.8 lb

## 2024-02-25 VITALS — BP 104/72 | HR 102 | Wt 172.7 lb

## 2024-02-25 DIAGNOSIS — Z3A30 30 weeks gestation of pregnancy: Secondary | ICD-10-CM | POA: Diagnosis not present

## 2024-02-25 DIAGNOSIS — D649 Anemia, unspecified: Secondary | ICD-10-CM | POA: Diagnosis not present

## 2024-02-25 DIAGNOSIS — Z3493 Encounter for supervision of normal pregnancy, unspecified, third trimester: Secondary | ICD-10-CM

## 2024-02-25 DIAGNOSIS — K5900 Constipation, unspecified: Secondary | ICD-10-CM

## 2024-02-25 DIAGNOSIS — O99012 Anemia complicating pregnancy, second trimester: Secondary | ICD-10-CM

## 2024-02-25 DIAGNOSIS — O99013 Anemia complicating pregnancy, third trimester: Secondary | ICD-10-CM

## 2024-02-25 DIAGNOSIS — O98513 Other viral diseases complicating pregnancy, third trimester: Secondary | ICD-10-CM

## 2024-02-25 DIAGNOSIS — D509 Iron deficiency anemia, unspecified: Secondary | ICD-10-CM

## 2024-02-25 DIAGNOSIS — B009 Herpesviral infection, unspecified: Secondary | ICD-10-CM

## 2024-02-25 MED ORDER — IRON SUCROSE 20 MG/ML IV SOLN
200.0000 mg | Freq: Once | INTRAVENOUS | Status: AC
  Administered 2024-02-25: 200 mg via INTRAVENOUS
  Filled 2024-02-25: qty 10

## 2024-02-25 MED ORDER — POLYETHYLENE GLYCOL 3350 17 GM/SCOOP PO POWD: 17.0000 g | Freq: Every day | ORAL | 1 refills | Status: AC | PRN

## 2024-02-25 NOTE — Progress Notes (Signed)
   Subjective:  Natasha Kelly is a 23 y.o. G3P1011 at [redacted]w[redacted]d being seen today for ongoing prenatal care.  She is currently monitored for the following issues for this low-risk pregnancy and has Anemia complicating pregnancy, second trimester; Supervision of low-risk pregnancy; HSV-2 infection complicating pregnancy, unspecified trimester; and History of gestational diabetes on their problem list.  Patient reports no complaints.  Contractions: Not present. Vag. Bleeding: None.  Movement: Present. Denies leaking of fluid.   The following portions of the patient's history were reviewed and updated as appropriate: allergies, current medications, past family history, past medical history, past social history, past surgical history and problem list. Problem list updated.  Objective:   Vitals:   02/25/24 0844  BP: 104/72  Pulse: (!) 102  Weight: 172 lb 11.2 oz (78.3 kg)    Fetal Status: Fetal Heart Rate (bpm): 152   Movement: Present     General:  Alert, oriented and cooperative. Patient is in no acute distress.  Skin: Skin is warm and dry. No rash noted.   Cardiovascular: Normal heart rate noted  Respiratory: Normal respiratory effort, no problems with respiration noted  Abdomen: Soft, gravid, appropriate for gestational age. Pain/Pressure: Present     Pelvic: Vag. Bleeding: None     Cervical exam deferred        Extremities: Normal range of motion.  Edema: None  Mental Status: Normal mood and affect. Normal behavior. Normal judgment and thought content.   Urinalysis:      Assessment and Plan:  Pregnancy: G3P1011 at [redacted]w[redacted]d  1. Encounter for supervision of low-risk pregnancy in third trimester BP and FHR normal Offered flu and tdap, declined both Discussed birth control, still undecided  2. Anemia complicating pregnancy, second trimester Lab Results  Component Value Date   HGB 8.9 (L) 02/06/2024  Receiving IV iron  Discussed she no longer need to take PO iron  since she is getting IV  iron   3. HSV-2 infection complicating pregnancy, unspecified trimester Ppx at 34-36 wks   Preterm labor symptoms and general obstetric precautions including but not limited to vaginal bleeding, contractions, leaking of fluid and fetal movement were reviewed in detail with the patient. Please refer to After Visit Summary for other counseling recommendations.  Return in 2 weeks (on 03/10/2024) for Dyad patient, ob visit.   Lola Donnice HERO, MD

## 2024-02-25 NOTE — Patient Instructions (Signed)

## 2024-02-25 NOTE — Progress Notes (Signed)
 Diagnosis: Iron Deficiency Anemia  Provider:  Chilton Greathouse MD  Procedure: IV Push  IV Type: Peripheral, IV Location: R Forearm  Venofer (Iron Sucrose), Dose: 200 mg  Post Infusion IV Care: Patient declined observation and Peripheral IV Discontinued  Discharge: Condition: Good, Destination: Home . AVS Declined  Performed by:  Adriana Mccallum, RN

## 2024-02-27 ENCOUNTER — Ambulatory Visit (INDEPENDENT_AMBULATORY_CARE_PROVIDER_SITE_OTHER)

## 2024-02-27 VITALS — BP 106/70 | HR 99 | Temp 98.4°F | Resp 16 | Ht 63.0 in | Wt 174.2 lb

## 2024-02-27 DIAGNOSIS — O99012 Anemia complicating pregnancy, second trimester: Secondary | ICD-10-CM

## 2024-02-27 DIAGNOSIS — O99013 Anemia complicating pregnancy, third trimester: Secondary | ICD-10-CM | POA: Diagnosis not present

## 2024-02-27 DIAGNOSIS — Z3A31 31 weeks gestation of pregnancy: Secondary | ICD-10-CM

## 2024-02-27 DIAGNOSIS — D649 Anemia, unspecified: Secondary | ICD-10-CM

## 2024-02-27 MED ORDER — IRON SUCROSE 20 MG/ML IV SOLN
200.0000 mg | Freq: Once | INTRAVENOUS | Status: AC
  Administered 2024-02-27: 200 mg via INTRAVENOUS
  Filled 2024-02-27: qty 10

## 2024-02-27 NOTE — Progress Notes (Signed)
 Diagnosis:  Anemia complicating pregnancy    Provider:  Lonna Coder MD  Procedure: IV Push  IV Type: Peripheral, IV Location: R Antecubital  Venofer  (Iron  Sucrose), Dose: 200 mg  Post Infusion IV Care: Observation period completed and Peripheral IV Discontinued  Discharge: Condition: Good, Destination: Home . AVS Declined  Performed by:  Briar Sword, RN

## 2024-03-03 ENCOUNTER — Ambulatory Visit

## 2024-03-03 MED ORDER — IRON SUCROSE 20 MG/ML IV SOLN: 200.0000 mg | Freq: Once | INTRAVENOUS | Status: DC

## 2024-03-06 ENCOUNTER — Ambulatory Visit (INDEPENDENT_AMBULATORY_CARE_PROVIDER_SITE_OTHER)

## 2024-03-06 VITALS — BP 115/73 | HR 98 | Temp 98.2°F | Resp 16 | Ht 63.0 in | Wt 176.6 lb

## 2024-03-06 DIAGNOSIS — Z3A32 32 weeks gestation of pregnancy: Secondary | ICD-10-CM | POA: Diagnosis not present

## 2024-03-06 DIAGNOSIS — O99012 Anemia complicating pregnancy, second trimester: Secondary | ICD-10-CM

## 2024-03-06 DIAGNOSIS — D509 Iron deficiency anemia, unspecified: Secondary | ICD-10-CM

## 2024-03-06 DIAGNOSIS — O99013 Anemia complicating pregnancy, third trimester: Secondary | ICD-10-CM

## 2024-03-06 MED ORDER — IRON SUCROSE 20 MG/ML IV SOLN
200.0000 mg | Freq: Once | INTRAVENOUS | Status: AC
  Administered 2024-03-06: 200 mg via INTRAVENOUS

## 2024-03-06 NOTE — Progress Notes (Signed)
 Diagnosis: Iron  Deficiency Anemia  Provider:  Praveen Mannam MD  Procedure: IV Push  IV Type: Peripheral, IV Location: L Antecubital  Venofer  (Iron  Sucrose), Dose: 200 mg  Post Infusion IV Care: Observation period completed and Peripheral IV Discontinued  Discharge: Condition: Good, Destination: Home . AVS Declined  Performed by:  Maximiano JONELLE Pouch, LPN

## 2024-03-10 ENCOUNTER — Encounter: Admitting: Family Medicine

## 2024-03-24 ENCOUNTER — Other Ambulatory Visit: Payer: Self-pay

## 2024-03-24 ENCOUNTER — Ambulatory Visit

## 2024-03-24 ENCOUNTER — Ambulatory Visit: Payer: Self-pay

## 2024-03-24 VITALS — BP 109/74 | HR 120 | Wt 180.3 lb

## 2024-03-24 DIAGNOSIS — B009 Herpesviral infection, unspecified: Secondary | ICD-10-CM

## 2024-03-24 DIAGNOSIS — O99012 Anemia complicating pregnancy, second trimester: Secondary | ICD-10-CM

## 2024-03-24 DIAGNOSIS — R42 Dizziness and giddiness: Secondary | ICD-10-CM

## 2024-03-24 DIAGNOSIS — Z3493 Encounter for supervision of normal pregnancy, unspecified, third trimester: Secondary | ICD-10-CM

## 2024-03-24 LAB — POCT HEMOGLOBIN-HEMACUE: Hemoglobin: 9.1 g/dL — ABNORMAL LOW (ref 12.0–15.0)

## 2024-03-24 MED ORDER — IRON (FERROUS SULFATE) 325 (65 FE) MG PO TABS: 1.0000 | ORAL_TABLET | ORAL | 0 refills | Status: AC

## 2024-03-24 MED ORDER — FAMOTIDINE 20 MG PO TABS: 20.0000 mg | ORAL_TABLET | Freq: Two times a day (BID) | ORAL | 1 refills | Status: AC | PRN

## 2024-03-24 NOTE — Patient Instructions (Addendum)
 Please go to the Maternity Assessment Unit (MAU) at [facility] if you experience vaginal bleeding, leaking/gush of fluid like your water  broke, notice decreased movement from your baby after doing kick counts, or if you have contractions the are becoming more intense or more frequent.   - try to drink at least 60oz of water  per day!  Fetal Movement Counts When you're pregnant, you might start feeling your baby move around the middle of your pregnancy. At first, these movements might feel like flutters, rolls, or swishes. As your baby grows, you might feel more kicks and jabs. Around week 28 of your pregnancy, your health care team may ask you to count how often your baby moves. This is important for all pregnancies, but especially for high-risk ones. Counting movements can help lessen the risk of stillbirth. Do this whenever you feel that you baby has been moving less than usual.  What is a fetal movement count? A fetal movement count is the number of times that you feel your baby move during a certain amount of time. This may also be called a kick count. There are many ways to do a kick count. Ask your team what is best for you. Pay attention to when your baby is most active. You may notice your baby's sleep and wake cycles. You may also notice things that make your baby move more. When you do a kick count, try to do it: When your baby is normally most active. At the same time each day.  How do I count fetal movements? Find a quiet, comfortable area. Sit or lie down. Write down the date, the start time, and the number of movements you feel. Count kicks, flutters, swishes, rolls, and jabs. Usually, you will feel at least 10 movements within 2 hours. Stop counting after you have felt 10 movements or if you have been counting for 2 hours. Write down the stop time.  Contact a health care provider if:  You don't feel 10 movements in 2 hours. Your baby isn't moving as it usually does. Your  baby isn't moving at all. If you're not able to reach your provider, go to an emergency room. This information is not intended to replace advice given to you by your health care provider. Make sure you discuss any questions you have with your health care provider.  Document Revised: 05/25/2023 Document Reviewed: 05/17/2022 Elsevier Patient Education  2025 [employer].  Signs and Symptoms of Labor Labor is the body's natural process of moving the baby and the placenta out of the uterus. The process of labor usually starts when the baby is full-term, 37 weeks and 0 days or more.   As your body prepares for labor and the birth of your baby, you may notice the following symptoms in the weeks and days before true labor starts: Your baby dropping lower into your pelvis to get into position for birth (lightening). When this happens, you may feel more pressure on your bladder and pelvic bone and less pressure on your ribs. This may make it easier to breathe. It may also cause you to need to urinate more often and have problems with bowel movements. Having practice contractions, also called Braxton Hicks contractions or false labor. These occur at irregular (unevenly spaced) intervals that are more than 10 minutes apart. False labor contractions are common after exercise or sexual activity. They will stop if you change position, rest, or drink fluids. These contractions are usually mild and do not  get stronger over time. They may feel like: A backache or back pain. Mild cramps, similar to menstrual cramps. Tightening or pressure in your abdomen. Passing a small amount of thick, bloody mucus from your vagina. This is called normal bloody show or losing your mucus plug. This may happen more than a week before labor begins, or right before labor begins, as the opening of the cervix starts to widen (dilate). For some women, the entire mucus plug passes at once. For others, pieces of the mucus plug may  gradually pass over several days.  Signs and symptoms that labor has begun Signs that you are in labor may include: Having contractions that come at regular (evenly spaced) intervals and increase in intensity. This may feel like more intense tightening or pressure in your abdomen that moves to your back. Feeling pressure in the vaginal area. Your water  breaking (called rupture of membranes). This is when the sac of fluid that surrounds your baby breaks. Fluid leaking from your vagina may be clear or blood-tinged. Labor usually starts within 24 hours of your water  breaking, but it may take longer to begin. Some people may feel a sudden gush of fluid; others may notice repeatedly damp underwear.  Go to the hospital when  Your water  breaks. Your labor has started: painful, regular contractions that are 5 minutes apart You have a fever. You have bright red blood coming from your vagina. You do not feel your baby moving. You have a severe headache with or without vision problems. You have chest pain or shortness of breath. These symptoms may represent a serious problem that is an emergency. Do not wait to see if the symptoms will go away. Get medical help right away. Call your local emergency services (911 in the [place] Do not drive yourself to the hospital.  Summary Labor is your body's natural process of moving your baby and the placenta out of your uterus. The process of labor usually starts when your baby is full-term When labor starts, or if your water  breaks, call your health care provider or nurse care line. Based on your situation, they will determine when you should go in for an exam. This information is not intended to replace advice given to you by your health care provider. Make sure you discuss any questions you have with your health care provider.  Document Revised: 09/14/2020 Document Reviewed: 09/14/2020-- adapted Elsevier Patient Education  2024 [employer].

## 2024-03-24 NOTE — Progress Notes (Signed)
   Subjective:  Natasha Kelly is a 23 y.o. G3P1011 at [redacted]w[redacted]d being seen today for ongoing prenatal care.  She is currently monitored for the following issues for this low-risk pregnancy and Patient Active Problem List   Diagnosis Date Noted   Supervision of low-risk pregnancy 10/09/2023   HSV-2 infection complicating pregnancy, unspecified trimester 10/09/2023   Anemia complicating pregnancy, second trimester 11/08/2021    Contractions: Irritability. Vag. Bleeding: None.  Movement: Present. Denies leaking of fluid.   Iron  infusions: 5 doses done 03/06/2024 but still dizzy. Dizzy when working or being active, no dizziness with position changes. Drinking H2O 3 times per day.  No constipation Valtrex : not taking. Primary outbreak was after last pregnancy. No sx or lesions currently.   4th trimester - peds: [facility]. Trifecta: ok - safe sleep: yes - car seat: not yet - contraception: thinking depo  The following portions of the patient's history were reviewed and updated as appropriate: allergies, current medications, past family history, past medical history, past social history, past surgical history and problem list. Problem list updated.  Objective:   Vitals:   03/24/24 1333  BP: 109/74  Pulse: (!) 120  Weight: 180 lb 5 oz (81.8 kg)    Fetal Status: Fetal Heart Rate (bpm): 150 Fundal Height: 35 cm Movement: Present     General:  Alert, oriented and cooperative. Patient is in no acute distress.  Skin: Skin is warm and dry. No rash noted.   Cardiovascular: Regular rhythm, tachycardic rate  Respiratory: Normal respiratory effort, no problems with respiration noted  Abdomen: Soft, gravid, appropriate for gestational age. Pain/Pressure: Present. FH 35cm  Pelvic: Vag. Bleeding: None     Cervical exam deferred        Extremities: Normal range of motion.  Edema: None  Mental Status: Normal mood and affect. Normal behavior. Normal judgment and thought content.   Urinalysis:       Assessment and Plan:  Pregnancy: G3P1011 at [redacted]w[redacted]d  1. Encounter for supervision of low-risk pregnancy in third trimester (Primary) - BP, FH, FHT appropriate.  - decline flu, Tdap - swabs next visit   2. HSV-2 infection complicating pregnancy, unspecified trimester Discussed purpose of suppression.  - Valtrex  suppression  3. Anemia complicating pregnancy, second trimester Hgb 8.9 at 28wk, down from first trimester labs.  - s/p Venofer  200mg  x5 doses - POC Hgb 9.1 today  4. Dizziness Ddx: orthostasis, anemia, pregnancy physiology, dehydration. Associated tachycardia.  - POC Hgb 9.1 today - encourage po hydration   Preterm labor symptoms and general obstetric precautions including but not limited to vaginal bleeding, contractions, leaking of fluid and fetal movement were reviewed in detail with the patient.  Future Appointments  Date Time Provider Department Center  04/07/2024 10:35 AM Ilean Norleen GAILS, MD [facility]  04/14/2024 10:35 AM Ilean Norleen GAILS, MD [facility]  04/21/2024 10:35 AM [facility] BABY DYAD PROVIDER [facility]  04/28/2024  8:55 AM Ilean Norleen GAILS, MD [facility]  05/05/2024 10:35 AM Ilean, Norleen GAILS, MD [facility]    Please refer to After Visit Summary for other counseling recommendations.   Barabara Maier, DO [facility] Fellow [facility] [employer]

## 2024-04-04 ENCOUNTER — Other Ambulatory Visit: Payer: Self-pay | Admitting: Family Medicine

## 2024-04-07 ENCOUNTER — Other Ambulatory Visit: Payer: Self-pay

## 2024-04-07 ENCOUNTER — Ambulatory Visit (INDEPENDENT_AMBULATORY_CARE_PROVIDER_SITE_OTHER): Admitting: Family Medicine

## 2024-04-07 ENCOUNTER — Other Ambulatory Visit (HOSPITAL_COMMUNITY)
Admission: RE | Admit: 2024-04-07 | Discharge: 2024-04-07 | Disposition: A | Discharge status: Home / Self Care | Source: Ambulatory Visit | Attending: Family Medicine | Admitting: Family Medicine

## 2024-04-07 VITALS — BP 109/70 | HR 120 | Wt 182.9 lb

## 2024-04-07 DIAGNOSIS — Z3493 Encounter for supervision of normal pregnancy, unspecified, third trimester: Secondary | ICD-10-CM

## 2024-04-07 DIAGNOSIS — Z3A36 36 weeks gestation of pregnancy: Secondary | ICD-10-CM

## 2024-04-07 DIAGNOSIS — B009 Herpesviral infection, unspecified: Secondary | ICD-10-CM

## 2024-04-07 DIAGNOSIS — O99012 Anemia complicating pregnancy, second trimester: Secondary | ICD-10-CM

## 2024-04-07 MED ORDER — VALACYCLOVIR HCL 500 MG PO TABS: 500.0000 mg | ORAL_TABLET | Freq: Two times a day (BID) | ORAL | 1 refills | Status: AC

## 2024-04-07 NOTE — Progress Notes (Signed)
 PRENATAL VISIT NOTE  Subjective:  Natasha Kelly is a 23 y.o. G3P1011 at [redacted]w[redacted]d being seen today for ongoing prenatal care.  She is currently monitored for the following issues for this low-risk pregnancy and has Anemia complicating pregnancy, second trimester; Supervision of low-risk pregnancy; and HSV-2 infection complicating pregnancy, unspecified trimester on their problem list.  Patient reports no bleeding, no cramping, no leaking, and occasional contractions.  Contractions: Irritability. Vag. Bleeding: None.  Movement: Present. Denies leaking of fluid.   The following portions of the patient's history were reviewed and updated as appropriate: allergies, current medications, past family history, past medical history, past social history, past surgical history and problem list.   Objective:   Vitals:   04/07/24 1032  BP: 109/70  Pulse: (!) 120  Weight: 182 lb 14.4 oz (83 kg)    Fetal Status:  Fetal Heart Rate (bpm): 141   Movement: Present    General: Alert, oriented and cooperative. Patient is in no acute distress.  Skin: Skin is warm and dry. No rash noted.   Cardiovascular: Normal heart rate noted  Respiratory: Normal respiratory effort, no problems with respiration noted  Abdomen: Soft, gravid, appropriate for gestational age.  Pain/Pressure: Absent     Pelvic: Cervical exam deferred        Extremities: Normal range of motion.  Edema: None  Mental Status: Normal mood and affect. Normal behavior. Normal judgment and thought content.      12/26/2023    3:21 PM 02/15/2022    4:04 PM 01/25/2022    2:30 PM  Depression screen PHQ 2/9  Decreased Interest 0 0 0  Down, Depressed, Hopeless 0 0 0  PHQ - 2 Score 0 0 0  Altered sleeping 0 0 1  Tired, decreased energy 0 0 0  Change in appetite 0 0 1  Feeling bad or failure about yourself  0 0 0  Trouble concentrating 0 0 0  Moving slowly or fidgety/restless 0 0 0  Suicidal thoughts 0 0 0  PHQ-9 Score 0  0  2   Difficult doing  work/chores Not difficult at all       Data saved with a previous flowsheet row definition        12/26/2023    3:21 PM 02/15/2022    4:05 PM 01/25/2022    2:29 PM 12/07/2021    1:34 PM  GAD 7 : Generalized Anxiety Score  Nervous, Anxious, on Edge 0 0 0 0  Control/stop worrying 0 0 0 0  Worry too much - different things 0 0 0 0  Trouble relaxing 0 0 0 0  Restless 0 0 0 0  Easily annoyed or irritable 0 0 1 0  Afraid - awful might happen 0 0 0 0  Total GAD 7 Score 0 0 1 0  Anxiety Difficulty Not difficult at all       Assessment and Plan:  Pregnancy: G3P1011 at [redacted]w[redacted]d 1. Encounter for supervision of low-risk pregnancy in third trimester (Primary) FHR BP appropriate today - Cervicovaginal ancillary only - Culture, beta strep (group b only)  2. HSV-2 infection complicating pregnancy, unspecified trimester No signs of an outbreak at this time Patient reports she is only getting 6 tablets per prescription, recent prescription for prophylactic dosing  3. Anemia complicating pregnancy, second trimester Patient reports still occasionally getting dizzy but not any worse than was before Most recent hemoglobin 9.1, status post iron  infusions  4. [redacted] weeks gestation of pregnancy   Preterm labor  symptoms and general obstetric precautions including but not limited to vaginal bleeding, contractions, leaking of fluid and fetal movement were reviewed in detail with the patient. Please refer to After Visit Summary for other counseling recommendations.   No follow-ups on file.  Future Appointments  Date Time Provider Department Center  04/14/2024 10:35 AM Ilean Norleen GAILS, MD [facility]  04/21/2024  3:55 PM Danny Geralds, DO [facility]  04/28/2024  8:55 AM Ilean Norleen GAILS, MD [facility]  05/05/2024 10:35 AM Ilean, Norleen GAILS, MD [facility]    Norleen GAILS Ilean, MD

## 2024-04-08 LAB — CERVICOVAGINAL ANCILLARY ONLY
Bacterial Vaginitis (gardnerella): NEGATIVE
Candida Glabrata: NEGATIVE
Candida Vaginitis: POSITIVE — AB
Chlamydia: NEGATIVE
Comment: NEGATIVE
Comment: NEGATIVE
Comment: NEGATIVE
Comment: NEGATIVE
Comment: NEGATIVE
Comment: NORMAL
Neisseria Gonorrhea: NEGATIVE
Trichomonas: NEGATIVE

## 2024-04-09 ENCOUNTER — Encounter: Payer: Self-pay | Admitting: Family Medicine

## 2024-04-09 ENCOUNTER — Ambulatory Visit: Payer: Self-pay | Admitting: Family Medicine

## 2024-04-09 ENCOUNTER — Other Ambulatory Visit: Payer: Self-pay

## 2024-04-09 MED ORDER — FLUCONAZOLE 150 MG PO TABS: 150.0000 mg | ORAL_TABLET | Freq: Once | ORAL | 0 refills | Status: AC

## 2024-04-09 MED ORDER — MICONAZOLE NITRATE 2 % VA CREA: 1.0000 | TOPICAL_CREAM | Freq: Every day | VAGINAL | 0 refills | Status: AC

## 2024-04-09 MED ORDER — TERCONAZOLE 0.4 % VA CREA: 1.0000 | TOPICAL_CREAM | Freq: Every day | VAGINAL | 0 refills | Status: AC

## 2024-04-10 LAB — CULTURE, BETA STREP (GROUP B ONLY): Strep Gp B Culture: POSITIVE — AB

## 2024-04-14 ENCOUNTER — Ambulatory Visit: Admitting: Family Medicine

## 2024-04-14 ENCOUNTER — Other Ambulatory Visit: Payer: Self-pay

## 2024-04-14 VITALS — BP 117/77 | HR 118 | Wt 185.4 lb

## 2024-04-14 DIAGNOSIS — Z3493 Encounter for supervision of normal pregnancy, unspecified, third trimester: Secondary | ICD-10-CM

## 2024-04-14 DIAGNOSIS — O99012 Anemia complicating pregnancy, second trimester: Secondary | ICD-10-CM

## 2024-04-14 DIAGNOSIS — B009 Herpesviral infection, unspecified: Secondary | ICD-10-CM | POA: Diagnosis not present

## 2024-04-14 DIAGNOSIS — O98513 Other viral diseases complicating pregnancy, third trimester: Secondary | ICD-10-CM

## 2024-04-14 DIAGNOSIS — B951 Streptococcus, group B, as the cause of diseases classified elsewhere: Secondary | ICD-10-CM

## 2024-04-14 DIAGNOSIS — O99013 Anemia complicating pregnancy, third trimester: Secondary | ICD-10-CM | POA: Diagnosis not present

## 2024-04-14 DIAGNOSIS — Z3A37 37 weeks gestation of pregnancy: Secondary | ICD-10-CM | POA: Diagnosis not present

## 2024-04-14 NOTE — Progress Notes (Signed)
 PRENATAL VISIT NOTE  Subjective:  Natasha Kelly is a 23 y.o. G3P1011 at [redacted]w[redacted]d being seen today for ongoing prenatal care.  She is currently monitored for the following issues for this low-risk pregnancy and has Anemia complicating pregnancy, second trimester; Supervision of low-risk pregnancy; and HSV-2 infection complicating pregnancy, unspecified trimester on their problem list.  Patient reports no bleeding, no contractions, no cramping, and no leaking.  Contractions: Irritability. Vag. Bleeding: None.  Movement: Present. Denies leaking of fluid.   The following portions of the patient's history were reviewed and updated as appropriate: allergies, current medications, past family history, past medical history, past social history, past surgical history and problem list.   Objective:   Vitals:   04/14/24 1042  BP: 117/77  Pulse: (!) 118  Weight: 185 lb 6 oz (84.1 kg)    Fetal Status:  Fetal Heart Rate (bpm): 142   Movement: Present    General: Alert, oriented and cooperative. Patient is in no acute distress.  Skin: Skin is warm and dry. No rash noted.   Cardiovascular: Normal heart rate noted  Respiratory: Normal respiratory effort, no problems with respiration noted  Abdomen: Soft, gravid, appropriate for gestational age.  Pain/Pressure: Absent     Pelvic: Cervical exam deferred        Extremities: Normal range of motion.  Edema: None  Mental Status: Normal mood and affect. Normal behavior. Normal judgment and thought content.      04/07/2024   11:26 AM 12/26/2023    3:21 PM 02/15/2022    4:04 PM  Depression screen PHQ 2/9  Decreased Interest 0 0 0  Down, Depressed, Hopeless 0 0 0  PHQ - 2 Score 0 0 0  Altered sleeping 0 0 0  Tired, decreased energy 0 0 0  Change in appetite 0 0 0  Feeling bad or failure about yourself  0 0 0  Trouble concentrating 0 0 0  Moving slowly or fidgety/restless 0 0 0  Suicidal thoughts 0 0 0  PHQ-9 Score 0 0  0   Difficult doing  work/chores  Not difficult at all      Data saved with a previous flowsheet row definition        04/07/2024   11:26 AM 12/26/2023    3:21 PM 02/15/2022    4:05 PM 01/25/2022    2:29 PM  GAD 7 : Generalized Anxiety Score  Nervous, Anxious, on Edge 0 0 0 0  Control/stop worrying 0 0 0 0  Worry too much - different things 0 0 0 0  Trouble relaxing 0 0 0 0  Restless 0 0 0 0  Easily annoyed or irritable 0 0 0 1  Afraid - awful might happen 0 0 0 0  Total GAD 7 Score 0 0 0 1  Anxiety Difficulty  Not difficult at all      Assessment and Plan:  Pregnancy: G3P1011 at [redacted]w[redacted]d 1. Encounter for supervision of low-risk pregnancy in third trimester (Primary) FHR BP appropriate today Follow-up in 1 week GBS positive, will need prophylaxis during labor  2. HSV-2 infection complicating pregnancy, unspecified trimester On Valtrex  No signs of an outbreak  3. Anemia complicating pregnancy, second trimester   4. [redacted] weeks gestation of pregnancy   Term labor symptoms and general obstetric precautions including but not limited to vaginal bleeding, contractions, leaking of fluid and fetal movement were reviewed in detail with the patient. Please refer to After Visit Summary for other counseling recommendations.   No  follow-ups on file.  Future Appointments  Date Time Provider Department Center  04/21/2024  3:55 PM Danny Geralds, [facility]  04/28/2024  8:55 AM Ilean Norleen GAILS, MD [facility]  05/05/2024 10:35 AM Ilean, Norleen GAILS, MD [facility]    Norleen GAILS Ilean, MD

## 2024-04-15 NOTE — Progress Notes (Signed)
 04/15/24 addendum: Pregnancy risk form completed. Rock Skip PEAK

## 2024-04-21 ENCOUNTER — Other Ambulatory Visit: Payer: Self-pay

## 2024-04-21 ENCOUNTER — Ambulatory Visit

## 2024-04-21 VITALS — BP 122/82 | HR 108 | Wt 186.0 lb

## 2024-04-21 DIAGNOSIS — O98513 Other viral diseases complicating pregnancy, third trimester: Secondary | ICD-10-CM

## 2024-04-21 DIAGNOSIS — O99013 Anemia complicating pregnancy, third trimester: Secondary | ICD-10-CM | POA: Diagnosis not present

## 2024-04-21 DIAGNOSIS — B009 Herpesviral infection, unspecified: Secondary | ICD-10-CM

## 2024-04-21 DIAGNOSIS — Z3A38 38 weeks gestation of pregnancy: Secondary | ICD-10-CM

## 2024-04-21 DIAGNOSIS — Z3493 Encounter for supervision of normal pregnancy, unspecified, third trimester: Secondary | ICD-10-CM

## 2024-04-21 DIAGNOSIS — B951 Streptococcus, group B, as the cause of diseases classified elsewhere: Secondary | ICD-10-CM | POA: Diagnosis not present

## 2024-04-21 DIAGNOSIS — O99012 Anemia complicating pregnancy, second trimester: Secondary | ICD-10-CM

## 2024-04-21 NOTE — Assessment & Plan Note (Signed)
 Discussed result, abx in labor.

## 2024-04-21 NOTE — Assessment & Plan Note (Signed)
 BP, FHT, and FH appropriate. MAU/labor precautions discussed.

## 2024-04-21 NOTE — Assessment & Plan Note (Signed)
 S/p infusions. POC Hgb 9.1

## 2024-04-21 NOTE — Assessment & Plan Note (Signed)
Taking Valtrex

## 2024-04-21 NOTE — Patient Instructions (Signed)
 Please go to the MAU (Maternity Assessment Unit) at [facility] if you experience vaginal bleeding,leaking/gush of fluid like your water broke, notice decreased movement from your baby after doing kick counts, or contractions the are becoming more intense or more frequent.   Fetal Movement Counts When you're pregnant, you might start feeling your baby move around the middle of your pregnancy. At first, these movements might feel like flutters, rolls, or swishes. As your baby grows, you might feel more kicks and jabs. Around week 28 of your pregnancy, your health care team may ask you to count how often your baby moves. This is important for all pregnancies, but especially for high-risk ones. Counting movements can help lessen the risk of stillbirth.  What is a fetal movement count? A fetal movement count is the number of times that you feel your baby move during a certain amount of time. This may also be called a kick count. There are many ways to do a kick count. Ask your team what is best for you. Pay attention to when your baby is most active. You may notice your baby's sleep and wake cycles. You may also notice things that make your baby move more. When you do a kick count, try to do it: When your baby is normally most active. At the same time each day.  How do I count fetal movements? Find a quiet, comfortable area. Sit or lie down. Write down the date, the start time, and the number of movements you feel. Count kicks, flutters, swishes, rolls, and jabs. Usually, you will feel at least 10 movements within 2 hours. Stop counting after you have felt 10 movements or if you have been counting for 2 hours. Write down the stop time.  Contact a health care provider if:  You don't feel 10 movements in 2 hours. Your baby isn't moving as it usually does. Your baby isn't moving at all. If you're not able to reach your provider, go to an emergency room. This information is not intended to  replace advice given to you by your health care provider. Make sure you discuss any questions you have with your health care provider.  Document Revised: 05/25/2023 Document Reviewed: 05/17/2022 Elsevier Patient Education  2025 [employer].  Signs and Symptoms of Labor Labor is the body's natural process of moving the baby and the placenta out of the uterus. The process of labor usually starts when the baby is full-term, 37 weeks and 0 days or more.   As your body prepares for labor and the birth of your baby, you may notice the following symptoms in the weeks and days before true labor starts: Your baby dropping lower into your pelvis to get into position for birth (lightening). When this happens, you may feel more pressure on your bladder and pelvic bone and less pressure on your ribs. This may make it easier to breathe. It may also cause you to need to urinate more often and have problems with bowel movements. Having practice contractions, also called Braxton Hicks contractions or false labor. These occur at irregular (unevenly spaced) intervals that are more than 10 minutes apart. False labor contractions are common after exercise or sexual activity. They will stop if you change position, rest, or drink fluids. These contractions are usually mild and do not get stronger over time. They may feel like: A backache or back pain. Mild cramps, similar to menstrual cramps. Tightening or pressure in your abdomen. Passing a small amount of  thick, bloody mucus from your vagina. This is called normal bloody show or losing your mucus plug. This may happen more than a week before labor begins, or right before labor begins, as the opening of the cervix starts to widen (dilate). For some women, the entire mucus plug passes at once. For others, pieces of the mucus plug may gradually pass over several days.  Signs and symptoms that labor has begun Signs that you are in labor may include: Having  contractions that come at regular (evenly spaced) intervals and increase in intensity. This may feel like more intense tightening or pressure in your abdomen that moves to your back. Feeling pressure in the vaginal area. Your water breaking (called rupture of membranes). This is when the sac of fluid that surrounds your baby breaks. Fluid leaking from your vagina may be clear or blood-tinged. Labor usually starts within 24 hours of your water breaking, but it may take longer to begin. Some people may feel a sudden gush of fluid; others may notice repeatedly damp underwear.  Go to the hospital when  Your water breaks. Your labor has started: painful, regular contractions that are 5 minutes apart or less. You have a fever. You have bright red blood coming from your vagina. You do not feel your baby moving. You have a severe headache with or without vision problems. You have chest pain or shortness of breath. These symptoms may represent a serious problem that is an emergency. Do not wait to see if the symptoms will go away. Get medical help right away. Call your local emergency services (911 in the [place] Do not drive yourself to the hospital.  Summary Labor is your body's natural process of moving your baby and the placenta out of your uterus. The process of labor usually starts when your baby is full-term When labor starts, or if your water breaks, call your health care provider or nurse care line. Based on your situation, they will determine when you should go in for an exam. This information is not intended to replace advice given to you by your health care provider. Make sure you discuss any questions you have with your health care provider.  Document Revised: 09/14/2020 Document Reviewed: 09/14/2020-- adapted Elsevier Patient Education  2024 [employer].

## 2024-04-21 NOTE — Progress Notes (Signed)
   PRENATAL VISIT NOTE Subjective:  Natasha Kelly is a 23 y.o. G3P1011 at [redacted]w[redacted]d being seen today for ongoing prenatal care.  She is currently monitored for the following issues for this low-risk pregnancy and  Patient Active Problem List   Diagnosis Date Noted   Positive GBS test 04/14/2024   Supervision of low-risk pregnancy 10/09/2023   HSV-2 infection complicating pregnancy, unspecified trimester 10/09/2023   Anemia complicating pregnancy, second trimester 11/08/2021    Patient reports no complaints.   Contractions: Irritability.  Vag. Bleeding: None.   Movement: Present.  Denies leaking of fluid.   - taking Valtrex   4th trimester - peds: [facility]. Trifecta: ok - safe sleep: yes - car seat: yes - contraception: depo  The following portions of the patient's history were reviewed and updated as appropriate: allergies, current medications, past family history, past medical history, past social history, past surgical history and problem list. Problem list updated.  Objective:   Vitals:   04/21/24 1557  BP: 122/82  Pulse: (!) 108  Weight: 186 lb (84.4 kg)    Fetal Status: Fetal Heart Rate (bpm): 138   Movement: Present     General:  Alert, oriented and cooperative. Patient is in no acute distress.  Skin: Skin is warm and dry. No rash noted.   Cardiovascular: Normal heart rate noted  Respiratory: Normal respiratory effort, no problems with respiration noted  Abdomen: Soft, gravid, appropriate for gestational age. Pain/Pressure: Absent     Pelvic: Vag. Bleeding: None     Cervical exam deferred        Extremities: Normal range of motion.  Edema: None  Mental Status: Normal mood and affect. Normal behavior. Normal judgment and thought content.   Urinalysis:      Assessment and Plan:  Natasha Kelly is 23 y.o. G3P1011 at [redacted]w[redacted]d, 04/30/2024, by Ultrasound Assessment & Plan Encounter for supervision of low-risk pregnancy in third trimester BP, FHT, and FH appropriate.  MAU/labor precautions discussed.     HSV-2 infection complicating pregnancy, unspecified trimester Taking Valtrex     Anemia complicating pregnancy, second trimester S/p infusions. POC Hgb 9.1    Positive GBS test Discussed result, abx in labor.       Term labor symptoms and general obstetric precautions including but not limited to vaginal bleeding, contractions, leaking of fluid and fetal movement were reviewed in detail with the patient. Please refer to After Visit Summary for other counseling recommendations.   Barabara Maier, DO FM-OB Fellow Center for [facility]  Future Appointments  Date Time Provider Department Center  04/28/2024  8:55 AM Ilean Norleen GAILS, MD [facility]  05/05/2024 10:35 AM Ilean Norleen GAILS, MD [facility]

## 2024-04-23 ENCOUNTER — Encounter: Payer: Self-pay | Admitting: *Deleted

## 2024-04-28 ENCOUNTER — Ambulatory Visit: Admitting: Family Medicine

## 2024-04-28 ENCOUNTER — Other Ambulatory Visit: Payer: Self-pay

## 2024-04-28 VITALS — BP 117/83 | HR 108 | Wt 191.5 lb

## 2024-04-28 DIAGNOSIS — Z3A39 39 weeks gestation of pregnancy: Secondary | ICD-10-CM

## 2024-04-28 DIAGNOSIS — O98513 Other viral diseases complicating pregnancy, third trimester: Secondary | ICD-10-CM

## 2024-04-28 DIAGNOSIS — B951 Streptococcus, group B, as the cause of diseases classified elsewhere: Secondary | ICD-10-CM

## 2024-04-28 DIAGNOSIS — B009 Herpesviral infection, unspecified: Secondary | ICD-10-CM

## 2024-04-28 DIAGNOSIS — O99012 Anemia complicating pregnancy, second trimester: Secondary | ICD-10-CM

## 2024-04-28 DIAGNOSIS — O99013 Anemia complicating pregnancy, third trimester: Secondary | ICD-10-CM

## 2024-04-28 DIAGNOSIS — Z3493 Encounter for supervision of normal pregnancy, unspecified, third trimester: Secondary | ICD-10-CM

## 2024-04-28 NOTE — Progress Notes (Signed)
 PRENATAL VISIT NOTE  Subjective:  Natasha Kelly is a 23 y.o. G3P1011 at [redacted]w[redacted]d being seen today for ongoing prenatal care.  She is currently monitored for the following issues for this low-risk pregnancy and has Anemia complicating pregnancy, second trimester; Supervision of low-risk pregnancy; HSV-2 infection complicating pregnancy, unspecified trimester; and Positive GBS test on their problem list.  Patient reports no bleeding, no contractions, no cramping, and no leaking.  Contractions: Irritability. Vag. Bleeding: None.  Movement: Present. Denies leaking of fluid.   The following portions of the patient's history were reviewed and updated as appropriate: allergies, current medications, past family history, past medical history, past social history, past surgical history and problem list.   Objective:   Vitals:   04/28/24 0834  BP: 117/83  Pulse: (!) 108  Weight: 191 lb 8 oz (86.9 kg)    Fetal Status:  Fetal Heart Rate (bpm): 152   Movement: Present    General: Alert, oriented and cooperative. Patient is in no acute distress.  Skin: Skin is warm and dry. No rash noted.   Cardiovascular: Normal heart rate noted  Respiratory: Normal respiratory effort, no problems with respiration noted  Abdomen: Soft, gravid, appropriate for gestational age.  Pain/Pressure: Absent     Pelvic: Cervical exam deferred        Extremities: Normal range of motion.  Edema: None  Mental Status: Normal mood and affect. Normal behavior. Normal judgment and thought content.      04/07/2024   11:26 AM 12/26/2023    3:21 PM 02/15/2022    4:04 PM  Depression screen PHQ 2/9  Decreased Interest 0 0 0  Down, Depressed, Hopeless 0 0 0  PHQ - 2 Score 0 0 0  Altered sleeping 0 0 0  Tired, decreased energy 0 0 0  Change in appetite 0 0 0  Feeling bad or failure about yourself  0 0 0  Trouble concentrating 0 0 0  Moving slowly or fidgety/restless 0 0 0  Suicidal thoughts 0 0 0  PHQ-9 Score 0 0  0    Difficult doing work/chores  Not difficult at all      Data saved with a previous flowsheet row definition        04/07/2024   11:26 AM 12/26/2023    3:21 PM 02/15/2022    4:05 PM 01/25/2022    2:29 PM  GAD 7 : Generalized Anxiety Score  Nervous, Anxious, on Edge 0 0 0 0  Control/stop worrying 0 0 0 0  Worry too much - different things 0 0 0 0  Trouble relaxing 0 0 0 0  Restless 0 0 0 0  Easily annoyed or irritable 0 0 0 1  Afraid - awful might happen 0 0 0 0  Total GAD 7 Score 0 0 0 1  Anxiety Difficulty  Not difficult at all      Assessment and Plan:  Pregnancy: G3P1011 at [redacted]w[redacted]d 1. Encounter for supervision of low-risk pregnancy in third trimester (Primary) FHR and BP appropriate today SVE 1.5/70/-2.  Membrane sweep performed with patient permission.  Discussed MAU precautions and signs of labor.  Patient will be scheduled for BPP next week and induction of labor at 41 weeks for postdates  2. HSV-2 infection complicating pregnancy, unspecified trimester On Valtrex  prophylaxis No signs of an outbreak  3. Anemia complicating pregnancy, second trimester Status postinfusion  4. Positive GBS test Will need prophylaxis during labor  5. [redacted] weeks gestation of pregnancy  Term labor  symptoms and general obstetric precautions including but not limited to vaginal bleeding, contractions, leaking of fluid and fetal movement were reviewed in detail with the patient. Please refer to After Visit Summary for other counseling recommendations.   No follow-ups on file.  Future Appointments  Date Time Provider Department Center  05/05/2024 10:35 AM Ilean Norleen GAILS, MD [facility]  05/07/2024  6:45 AM [facility] VICTORINE ROOM [facility] None    Norleen GAILS Ilean, MD

## 2024-04-29 ENCOUNTER — Telehealth (HOSPITAL_COMMUNITY): Payer: Self-pay | Admitting: *Deleted

## 2024-04-29 ENCOUNTER — Encounter (HOSPITAL_COMMUNITY): Payer: Self-pay | Admitting: Obstetrics and Gynecology

## 2024-04-29 ENCOUNTER — Inpatient Hospital Stay (HOSPITAL_COMMUNITY): Admitting: Anesthesiology

## 2024-04-29 ENCOUNTER — Other Ambulatory Visit: Payer: Self-pay

## 2024-04-29 ENCOUNTER — Inpatient Hospital Stay (HOSPITAL_COMMUNITY)
Admission: AD | Admit: 2024-04-29 | Discharge: 2024-05-01 | Disposition: A | Source: Home / Self Care | Attending: Obstetrics and Gynecology | Admitting: Obstetrics and Gynecology

## 2024-04-29 DIAGNOSIS — A6000 Herpesviral infection of urogenital system, unspecified: Secondary | ICD-10-CM | POA: Diagnosis present

## 2024-04-29 DIAGNOSIS — O99824 Streptococcus B carrier state complicating childbirth: Secondary | ICD-10-CM | POA: Diagnosis present

## 2024-04-29 DIAGNOSIS — O9902 Anemia complicating childbirth: Secondary | ICD-10-CM | POA: Diagnosis present

## 2024-04-29 DIAGNOSIS — Z3493 Encounter for supervision of normal pregnancy, unspecified, third trimester: Principal | ICD-10-CM

## 2024-04-29 DIAGNOSIS — O134 Gestational [pregnancy-induced] hypertension without significant proteinuria, complicating childbirth: Secondary | ICD-10-CM

## 2024-04-29 DIAGNOSIS — O9832 Other infections with a predominantly sexual mode of transmission complicating childbirth: Secondary | ICD-10-CM | POA: Diagnosis present

## 2024-04-29 DIAGNOSIS — O99820 Streptococcus B carrier state complicating pregnancy: Secondary | ICD-10-CM

## 2024-04-29 DIAGNOSIS — B009 Herpesviral infection, unspecified: Secondary | ICD-10-CM

## 2024-04-29 DIAGNOSIS — Z3A39 39 weeks gestation of pregnancy: Secondary | ICD-10-CM

## 2024-04-29 LAB — COMPREHENSIVE METABOLIC PANEL WITH GFR
ALT: 13 U/L (ref 0–44)
AST: 23 U/L (ref 15–41)
Albumin: 4 g/dL (ref 3.5–5.0)
Alkaline Phosphatase: 162 U/L — ABNORMAL HIGH (ref 38–126)
Anion gap: 11 (ref 5–15)
BUN: <5 mg/dL — ABNORMAL LOW (ref 6–20)
CO2: 23 mmol/L (ref 22–32)
Calcium: 9.8 mg/dL (ref 8.9–10.3)
Chloride: 102 mmol/L (ref 98–111)
Creatinine, Ser: 0.54 mg/dL (ref 0.44–1.00)
GFR, Estimated: >60 mL/min (ref >60)
Glucose, Bld: 89 mg/dL (ref 70–99)
Potassium: 3.6 mmol/L (ref 3.5–5.1)
Sodium: 136 mmol/L (ref 135–145)
Total Bilirubin: <0.2 mg/dL (ref 0.0–1.2)
Total Protein: 7 g/dL (ref 6.5–8.1)

## 2024-04-29 LAB — PROTEIN / CREATININE RATIO, URINE
Creatinine, Urine: 200 mg/dL
Protein Creatinine Ratio: 0.1 mg/mg (ref <0.2)
Total Protein, Urine: 26 mg/dL

## 2024-04-29 LAB — CBC
HCT: 38 % (ref 36.0–46.0)
Hemoglobin: 12.4 g/dL (ref 12.0–15.0)
MCH: 27.4 pg (ref 26.0–34.0)
MCHC: 32.6 g/dL (ref 30.0–36.0)
MCV: 84.1 fL (ref 80.0–100.0)
Platelets: 271 K/uL (ref 150–400)
RBC: 4.52 MIL/uL (ref 3.87–5.11)
RDW: 21 % — ABNORMAL HIGH (ref 11.5–15.5)
WBC: 7.2 K/uL (ref 4.0–10.5)
nRBC: 0 % (ref 0.0–0.2)

## 2024-04-29 LAB — TYPE AND SCREEN
ABO/RH(D): A POS
Antibody Screen: NEGATIVE

## 2024-04-29 LAB — SYPHILIS: RPR W/REFLEX TO RPR TITER AND TREPONEMAL ANTIBODIES, TRADITIONAL SCREENING AND DIAGNOSIS ALGORITHM: RPR Ser Ql: NONREACTIVE

## 2024-04-29 MED ORDER — ONDANSETRON HCL 4 MG/2ML IJ SOLN
4.0000 mg | Freq: Four times a day (QID) | INTRAMUSCULAR | Status: DC | PRN
  Administered 2024-04-29: 17:00:00 4 mg via INTRAVENOUS
  Filled 2024-04-29: qty 2

## 2024-04-29 MED ORDER — LACTATED RINGERS IV SOLN: INTRAVENOUS | Status: DC

## 2024-04-29 MED ORDER — LIDOCAINE HCL (PF) 1 % IJ SOLN
INTRAMUSCULAR | Status: DC | PRN
  Administered 2024-04-29: 13:00:00 8 mL via EPIDURAL

## 2024-04-29 MED ORDER — COCONUT OIL OIL: 1.0000 | TOPICAL_OIL | Status: DC | PRN

## 2024-04-29 MED ORDER — ZOLPIDEM TARTRATE 5 MG PO TABS: 5.0000 mg | ORAL_TABLET | Freq: Every evening | ORAL | Status: DC | PRN

## 2024-04-29 MED ORDER — LIDOCAINE HCL (PF) 1 % IJ SOLN: 30.0000 mL | INTRAMUSCULAR | Status: DC | PRN

## 2024-04-29 MED ORDER — OXYTOCIN-SODIUM CHLORIDE 30-0.9 UT/500ML-% IV SOLN
2.5000 [IU]/h | INTRAVENOUS | Status: DC
  Administered 2024-04-29: 19:00:00 2.5 [IU]/h via INTRAVENOUS
  Filled 2024-04-29: qty 500

## 2024-04-29 MED ORDER — ACETAMINOPHEN 325 MG PO TABS: 650.0000 mg | ORAL_TABLET | ORAL | Status: DC | PRN

## 2024-04-29 MED ORDER — SIMETHICONE 80 MG PO CHEW: 80.0000 mg | CHEWABLE_TABLET | ORAL | Status: DC | PRN

## 2024-04-29 MED ORDER — ONDANSETRON HCL 4 MG/2ML IJ SOLN: 4.0000 mg | INTRAMUSCULAR | Status: AC | PRN

## 2024-04-29 MED ORDER — FENTANYL-BUPIVACAINE-NACL 0.5-0.125-0.9 MG/250ML-% EP SOLN
12.0000 mL/h | EPIDURAL | Status: DC | PRN
  Filled 2024-04-29: qty 250

## 2024-04-29 MED ORDER — FAMOTIDINE IN NACL 20-0.9 MG/50ML-% IV SOLN
20.0000 mg | Freq: Once | INTRAVENOUS | Status: AC
  Administered 2024-04-29: 16:00:00 20 mg via INTRAVENOUS
  Filled 2024-04-29: qty 50

## 2024-04-29 MED ORDER — ACETAMINOPHEN 325 MG PO TABS
650.0000 mg | ORAL_TABLET | ORAL | Status: DC | PRN
  Administered 2024-04-30 – 2024-05-01 (×5): 650 mg via ORAL
  Filled 2024-04-29 (×5): qty 2

## 2024-04-29 MED ORDER — PRENATAL MULTIVITAMIN CH
1.0000 | ORAL_TABLET | Freq: Every day | ORAL | Status: DC
  Administered 2024-04-30 – 2024-05-01 (×2): 1 via ORAL
  Filled 2024-04-29 (×2): qty 1

## 2024-04-29 MED ORDER — SOD CITRATE-CITRIC ACID 500-334 MG/5ML PO SOLN: 30.0000 mL | ORAL | Status: DC | PRN

## 2024-04-29 MED ORDER — PENICILLIN G POT IN DEXTROSE 60000 UNIT/ML IV SOLN
3.0000 10*6.[IU] | INTRAVENOUS | Status: DC
  Administered 2024-04-29: 17:00:00 3 10*6.[IU] via INTRAVENOUS
  Filled 2024-04-29 (×3): qty 50

## 2024-04-29 MED ORDER — WITCH HAZEL-GLYCERIN EX PADS: 1.0000 | MEDICATED_PAD | CUTANEOUS | Status: DC | PRN

## 2024-04-29 MED ORDER — OXYCODONE-ACETAMINOPHEN 5-325 MG PO TABS: 1.0000 | ORAL_TABLET | ORAL | Status: DC | PRN

## 2024-04-29 MED ORDER — DIPHENHYDRAMINE HCL 50 MG/ML IJ SOLN: 12.5000 mg | INTRAMUSCULAR | Status: DC | PRN

## 2024-04-29 MED ORDER — ONDANSETRON HCL 4 MG PO TABS: 4.0000 mg | ORAL_TABLET | ORAL | Status: AC | PRN

## 2024-04-29 MED ORDER — LACTATED RINGERS IV SOLN: 500.0000 mL | INTRAVENOUS | Status: DC | PRN

## 2024-04-29 MED ORDER — BENZOCAINE-MENTHOL 20-0.5 % EX AERO: 1.0000 | INHALATION_SPRAY | CUTANEOUS | Status: DC | PRN

## 2024-04-29 MED ORDER — OXYCODONE-ACETAMINOPHEN 5-325 MG PO TABS: 2.0000 | ORAL_TABLET | ORAL | Status: DC | PRN

## 2024-04-29 MED ORDER — TETANUS-DIPHTH-ACELL PERTUSSIS 5-2-15.5 LF-MCG/0.5 IM SUSP: 0.5000 mL | Freq: Once | INTRAMUSCULAR | Status: DC

## 2024-04-29 MED ORDER — PHENYLEPHRINE 80 MCG/ML (10ML) SYRINGE FOR IV PUSH (FOR BLOOD PRESSURE SUPPORT): 80.0000 ug | PREFILLED_SYRINGE | INTRAVENOUS | Status: DC | PRN

## 2024-04-29 MED ORDER — EPHEDRINE 5 MG/ML INJ: 10.0000 mg | INTRAVENOUS | Status: DC | PRN

## 2024-04-29 MED ORDER — ERYTHROMYCIN 5 MG/GM OP OINT
TOPICAL_OINTMENT | OPHTHALMIC | Status: AC
  Filled 2024-04-29: qty 1

## 2024-04-29 MED ORDER — LACTATED RINGERS IV SOLN
500.0000 mL | Freq: Once | INTRAVENOUS | Status: AC
  Administered 2024-04-29: 12:00:00 500 mL via INTRAVENOUS

## 2024-04-29 MED ORDER — IBUPROFEN 600 MG PO TABS
600.0000 mg | ORAL_TABLET | Freq: Four times a day (QID) | ORAL | Status: DC
  Administered 2024-04-29 – 2024-05-01 (×7): 600 mg via ORAL
  Filled 2024-04-29 (×7): qty 1

## 2024-04-29 MED ORDER — SENNOSIDES-DOCUSATE SODIUM 8.6-50 MG PO TABS
2.0000 | ORAL_TABLET | ORAL | Status: DC
  Administered 2024-04-30 – 2024-05-01 (×2): 2 via ORAL
  Filled 2024-04-29 (×2): qty 2

## 2024-04-29 MED ORDER — OXYTOCIN BOLUS FROM INFUSION
333.0000 mL | Freq: Once | INTRAVENOUS | Status: AC
  Administered 2024-04-29: 19:00:00 333 mL via INTRAVENOUS

## 2024-04-29 MED ORDER — SODIUM CHLORIDE 0.9 % IV SOLN
5.0000 10*6.[IU] | Freq: Once | INTRAVENOUS | Status: AC
  Administered 2024-04-29: 12:00:00 5 10*6.[IU] via INTRAVENOUS
  Filled 2024-04-29: qty 5

## 2024-04-29 MED ORDER — DIPHENHYDRAMINE HCL 25 MG PO CAPS
25.0000 mg | ORAL_CAPSULE | Freq: Four times a day (QID) | ORAL | Status: DC | PRN
  Administered 2024-04-29: 21:00:00 25 mg via ORAL
  Filled 2024-04-29: qty 1

## 2024-04-29 MED ORDER — DIBUCAINE (PERIANAL) 1 % EX OINT: 1.0000 | TOPICAL_OINTMENT | CUTANEOUS | Status: DC | PRN

## 2024-04-29 NOTE — Anesthesia Procedure Notes (Signed)
 Epidural Patient location during procedure: OB Start time: 04/29/2024 12:50 PM End time: 04/29/2024 12:55 PM  Staffing Anesthesiologist: Mallory Manus, MD  Preanesthetic Checklist Completed: patient identified, IV checked, site marked, risks and benefits discussed, surgical consent, monitors and equipment checked, pre-op evaluation and timeout performed  Epidural Patient position: sitting Prep: DuraPrep and site prepped and draped Patient monitoring: continuous pulse ox and blood pressure Approach: midline Location: L4-L5 Injection technique: LOR air  Needle:  Needle type: Tuohy  Needle gauge: 17 G Needle length: 9 cm and 9 Needle insertion depth: 6 cm Catheter type: closed end flexible Catheter size: 19 Gauge Catheter at skin depth: 12 cm Test dose: negative  Assessment Events: blood not aspirated, no cerebrospinal fluid, injection not painful, no injection resistance, no paresthesia and negative IV test

## 2024-04-29 NOTE — MAU Note (Signed)
 Natasha Kelly is a 23 y.o. at [redacted]w[redacted]d here in MAU reporting: contractions every 2 minutes starting around 8am. Mucous-like discharge with bloody show.  LMP: 07/05/2023 Onset of complaint: 8am on Tuesday, 04/29/2024 Pain score: 10/10 with contractions Vitals:   04/29/24 1034 04/29/24 1035  BP: (!) 139/91   Pulse: (!) 102   Resp: 18   Temp: 98.4 F (36.9 C)   SpO2:  97%     FHT: 151bpm Lab orders placed from triage: Advertising Copywriter

## 2024-04-29 NOTE — Progress Notes (Signed)
 LABOR PROGRESS NOTE  Natasha Kelly is a 23 y.o. G3P1011 at [redacted]w[redacted]d  admitted for spontaneous onset of labor.  Also found to have elevated blood pressures and nonreactive NST.  Subjective: Comfortable with epidural  Objective: BP (!) 109/56   Pulse (!) 123   Temp 98.1 F (36.7 C) (Oral)   Resp 16   Ht 5' 3 (1.6 m)   Wt 82.6 kg   LMP 07/05/2023   SpO2 99%   BMI 32.24 kg/m  or  Vitals:   04/29/24 1500 04/29/24 1531 04/29/24 1601 04/29/24 1632  BP: 112/63 111/67 107/67 (!) 109/56  Pulse: (!) 107 (!) 113 (!) 113 (!) 123  Resp:  17  16  Temp:    98.1 F (36.7 C)  TempSrc:    Oral  SpO2:      Weight:      Height:         Dilation: 5.5 Effacement (%): 80 Cervical Position: Middle Station: -2 Presentation: Vertex Exam by:: Dr. Ilean FHT: baseline rate 140, moderate varibility, occasional acel, occasional variable decel Toco: 3 to 5 minutes  Labs: Lab Results  Component Value Date   WBC 7.2 04/29/2024   HGB 12.4 04/29/2024   HCT 38.0 04/29/2024   MCV 84.1 04/29/2024   PLT 271 04/29/2024    Patient Active Problem List   Diagnosis Date Noted   Indication for care in labor or delivery 04/29/2024   Positive GBS test 04/14/2024   Supervision of low-risk pregnancy 10/09/2023   HSV-2 infection complicating pregnancy, unspecified trimester 10/09/2023   Anemia complicating pregnancy, second trimester 11/08/2021    Assessment / Plan: 23 y.o. G3P1011 at [redacted]w[redacted]d here for SOL  Labor: Progressing well.  Discussed AROM and AROM with light meconium fluid.  Tolerated well.  Making great progress.  Will recheck in 2 hours. Fetal Wellbeing: Category 1 Pain Control: Epidural in place Anticipated MOD: Vaginal delivery  Steffan Ilean, MD Attending Family Medicine Physician, [facility] for [facility], [facility]  04/29/2024 5:32 PM

## 2024-04-29 NOTE — H&P (Signed)
 OBSTETRIC ADMISSION HISTORY AND PHYSICAL  Natasha Kelly is a 23 y.o. female G3P1011 with IUP at [redacted]w[redacted]d (dated by US , Estimated Date of Delivery: 04/30/24) presenting for IOL for gHTN.   She reports +FMs, No LOF, no VB, no blurry vision, headaches or peripheral edema, and RUQ pain.    She plans on breast feeding. She request OP IUD for birth control.  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   Tinea versicolor 02/01/2022   Trichomonas infection    UTI (urinary tract infection)     Past Surgical History: Past Surgical History:  Procedure Laterality Date   NO PAST SURGERIES      Obstetrical History: OB History     Gravida  3   Para  1   Term  1   Preterm  0   AB  1   Living  1      SAB  1   IAB  0   Ectopic  0   Multiple  0   Live Births  1           Social History Social History   Socioeconomic History   Marital status: Single    Spouse name: Not on file   Number of children: Not on file   Years of education: Not on file   Highest education level: Not on file  Occupational History   Not on file  Tobacco Use   Smoking status: Never   Smokeless tobacco: Never  Vaping Use   Vaping status: Never Used  Substance and Sexual Activity   Alcohol use: Not Currently   Drug use: Never   Sexual activity: Yes    Birth control/protection: Condom  Other Topics Concern   Not on file  Social History Narrative   Not on file   Social Drivers of Health   Tobacco Use: Low Risk (04/29/2024)   Patient History    Smoking Tobacco Use: Never    Smokeless Tobacco Use: Never    Passive Exposure: Not on file  Financial Resource Strain: Not on  file  Food Insecurity: No Food Insecurity (04/29/2024)   Epic    Worried About Programme Researcher, Broadcasting/film/video in the Last Year: Never true    Ran Out of Food in the Last Year: Never true  Transportation Needs: No Transportation Needs (04/29/2024)   Epic    Lack of Transportation (Medical): No    Lack of Transportation (Non-Medical): No  Physical Activity: Not on file  Stress: Not on file  Social Connections: Unknown (09/27/2021)   Received from [facility]   Social Network    Social Network: Not on file  Depression (PHQ2-9): Low Risk (04/07/2024)   Depression (PHQ2-9)    PHQ-2 Score: 0  Alcohol Screen: Not on file  Housing: Unknown (04/29/2024)   Epic    Unable to Pay for Housing in the Last Year: No    Number of Times Moved in the Last Year: Not on file    Homeless in the Last Year: No  Utilities: Not At Risk (04/29/2024)   Epic    Threatened with loss of utilities: No  Health Literacy: Not on  file    Family History: Family History  Problem Relation Age of Onset   Healthy Mother    Healthy Father    Asthma Sister    Cancer Maternal Grandmother    Diabetes Neg Hx    Heart disease Neg Hx    Hypertension Neg Hx    Stroke Neg Hx     Allergies: Allergies[1]  Medications Prior to Admission  Medication Sig Dispense Refill Last Dose/Taking   Prenatal Vit-Fe Fumarate-FA (PREPLUS) 27-1 MG TABS Take 1 tablet by mouth daily. 30 tablet 13 Past Week   valACYclovir  (VALTREX ) 500 MG tablet Take 1 tablet (500 mg total) by mouth 2 (two) times daily. 60 tablet 1 04/28/2024   Blood Pressure Monitoring (BLOOD PRESSURE KIT) DEVI 1 Device by Does not apply route daily. (Patient not taking: Reported on 03/24/2024) 1 each 0    famotidine  (PEPCID ) 20 MG tablet Take 1 tablet (20 mg total) by mouth 2 (two) times daily as needed for heartburn or indigestion. (Patient not taking: Reported on 04/07/2024) 90 tablet 1    Iron , Ferrous Sulfate , 325 (65 Fe) MG TABS Take 1 tablet by mouth every other day.  (Patient not taking: Reported on 04/07/2024) 90 tablet 0    Misc. Devices (GOJJI WEIGHT SCALE) MISC 1 Device by Does not apply route daily. (Patient not taking: Reported on 04/07/2024) 1 each 0    polyethylene glycol powder (GLYCOLAX /MIRALAX ) 17 GM/SCOOP powder Take 17 g by mouth daily as needed. (Patient not taking: Reported on 04/07/2024) 510 g 1      Review of Systems  All systems reviewed and negative except as stated in HPI.  Blood pressure 112/62, pulse (!) 101, temperature 98.5 F (36.9 C), temperature source Oral, resp. rate 16, height 5' 3 (1.6 m), weight 82.6 kg, last menstrual period 07/05/2023, SpO2 99%. General appearance: alert, cooperative, and appears stated age Lungs: breathing comfortably on room air Heart: regular rate Abdomen: soft, non-tender; gravid, 7 lbs by [place] Extremities: no edema of bilateral lower extremities Presentation: cephalic Fetal monitoringBaseline: 140  bpm, Variability: Fair (1-6 bpm), Accelerations: none, and Decelerations: Absent Uterine activity difficult to trace Dilation: 5.5 Effacement (%): 80 Station: -2 Exam by:: Dr. Ilean   Prenatal labs: ABO, Rh: --/--/A POS (12/16 1152) Antibody: NEG (12/16 1152) Rubella: 3.03 (08/13 1457) RPR: NON REACTIVE (12/16 1153)  HBsAg: Negative (08/13 1457)  HIV: Non Reactive (09/24 0945)  GBS: Positive/-- (11/24 1113)  2 hr Glucola normal Genetic screening  LR Anatomy US  normal Last US : At [redacted]w[redacted]d - cephalic presentation, EFW 1366 (47 %tile), AC 62%  Prenatal Transfer Tool  Maternal Diabetes: No Genetic Screening: Normal Maternal Ultrasounds/Referrals: Normal Fetal Ultrasounds or other Referrals:  None Maternal Substance Abuse:  No Significant Maternal Medications:  None Significant Maternal Lab Results:  Group B Strep positive Number of Prenatal Visits:greater than 3 verified prenatal visits Other Comments:  None  Results for orders placed or performed during the hospital encounter of  04/29/24 (from the past 24 hours)  Type and screen [facility]   Collection Time: 04/29/24 11:52 AM  Result Value Ref Range   ABO/RH(D) A POS    Antibody Screen NEG    Sample Expiration      05/02/2024,2359 Performed at [facility], [address] [place], [place] [address]   CBC   Collection Time: 04/29/24 11:53 AM  Result Value Ref Range   WBC 7.2 4.0 - 10.5 K/uL   RBC 4.52 3.87 - 5.11 MIL/uL  Hemoglobin 12.4 12.0 - 15.0 g/dL   HCT 61.9 63.9 - 53.9 %   MCV 84.1 80.0 - 100.0 fL   MCH 27.4 26.0 - 34.0 pg   MCHC 32.6 30.0 - 36.0 g/dL   RDW 78.9 (H) 88.4 - 84.4 %   Platelets 271 150 - 400 K/uL   nRBC 0.0 0.0 - 0.2 %  RPR   Collection Time: 04/29/24 11:53 AM  Result Value Ref Range   RPR Ser Ql NON REACTIVE NON REACTIVE  Comprehensive metabolic panel   Collection Time: 04/29/24 11:53 AM  Result Value Ref Range   Sodium 136 135 - 145 mmol/L   Potassium 3.6 3.5 - 5.1 mmol/L   Chloride 102 98 - 111 mmol/L   CO2 23 22 - 32 mmol/L   Glucose, Bld 89 70 - 99 mg/dL   BUN <5 (L) 6 - 20 mg/dL   Creatinine, Ser 9.45 0.44 - 1.00 mg/dL   Calcium 9.8 8.9 - 89.6 mg/dL   Total Protein 7.0 6.5 - 8.1 g/dL   Albumin 4.0 3.5 - 5.0 g/dL   AST 23 15 - 41 U/L   ALT 13 0 - 44 U/L   Alkaline Phosphatase 162 (H) 38 - 126 U/L   Total Bilirubin <0.2 0.0 - 1.2 mg/dL   GFR, Estimated >39 >39 mL/min   Anion gap 11 5 - 15  Protein / creatinine ratio, urine   Collection Time: 04/29/24 12:25 PM  Result Value Ref Range   Creatinine, Urine 200 mg/dL   Total Protein, Urine 26 mg/dL   Protein Creatinine Ratio 0.1 <0.2 mg/mg    Patient Active Problem List   Diagnosis Date Noted   Indication for care in labor or delivery 04/29/2024   Positive GBS test 04/14/2024   Supervision of low-risk pregnancy 10/09/2023   HSV-2 infection complicating pregnancy, unspecified trimester 10/09/2023   Anemia complicating pregnancy, second trimester 11/08/2021    Assessment/Plan:  Natasha Kelly is a 23 y.o. G3P1011 at [redacted]w[redacted]d here for IOL for elevated blood pressures.  #Labor: Patient presented for painful contractions.  Had multiple elevated blood pressures initially which could have been related to pain.  They normalized after she got the epidural.  We were initially going to watch her but she also had minimal variability with no accelerations so decided to bring in.  She has been making change spontaneously so we will continue to monitor.  She is GBS positive so we will need to wait 4 hours after initial penicillin  dose before breaking her water .  Patient agreeable to this #Pain: Epidural as soon as possible #FWB: Category 1 #ID:  GBS pos. Treating with PCN #MOF: breast #MOC: OP IUD #Circ:  N/A  # Elevated BP: Could be gestational hypertension but could also be pain related.  CMP normal.  UPC within normal limits. Continue to monitor. #HSV: Continue Valtrex   Norleen LULLA Rover, MD 04/29/2024 12:48 PM        [1] No Known Allergies

## 2024-04-29 NOTE — Discharge Summary (Signed)
 Postpartum Discharge Summary  Date of Service updated***     Patient Name: Natasha Kelly DOB: 2000-09-29 MRN: [id number]  Date of admission: 04/29/2024 Delivery date:04/29/2024 Delivering provider: JERRIE GATHERS Date of discharge: 04/29/2024  Admitting diagnosis: Indication for care in labor or delivery [O75.9] Intrauterine pregnancy: [redacted]w[redacted]d     Secondary diagnosis:  Principal Problem:   Indication for care in labor or delivery Active Problems:   Vaginal delivery   Obstetric vaginal laceration  Additional problems: ***    Discharge diagnosis: {DX.:23714}                                              Post partum procedures:{Postpartum procedures:23558} Augmentation: AROM Complications: None  Hospital course: Onset of Labor With Vaginal Delivery      23 y.o. yo G3P1011 at [redacted]w[redacted]d was admitted in Latent Labor on 04/29/2024. Labor course was complicated by none  Membrane Rupture Time/Date: 5:26 PM,04/29/2024  Delivery Method:Vaginal, Spontaneous Operative Delivery:N/A Episiotomy: None Lacerations:  Perineal;Periurethral Patient had a postpartum course complicated by ***.  She is ambulating, tolerating a regular diet, passing flatus, and urinating well. Patient is discharged home in stable condition on 04/29/2024.  Newborn Data: Birth date:04/29/2024 Birth time:6:54 PM Gender:Female Living status:Living Apgars: ,9  Weight:3520 g  Magnesium  Sulfate received: {Mag received:30440022} BMZ received: No Rhophylac:N/A MMR:N/A T-DaP:declined Flu: No - declined RSV Vaccine received: No Transfusion:{Transfusion received:30440034}  Immunizations received: Immunization History  Administered Date(s) Administered   Influenza,inj,Quad PF,6+ Mos 02/01/2022   Tdap 02/01/2022    Physical exam  Vitals:   04/29/24 1842 04/29/24 1912 04/29/24 1916 04/29/24 1930  BP: (!) 127/95 128/73 114/80 98/68  Pulse:  (!) 114 (!) 120 100  Resp:      Temp:      TempSrc:      SpO2:       Weight:      Height:       General: {Exam; general:21111117} Lochia: {Desc; appropriate/inappropriate:30686::appropriate} Uterine Fundus: {Desc; firm/soft:30687} Incision: {Exam; incision:21111123} DVT Evaluation: {Exam; dvt:2111122} Labs: Lab Results  Component Value Date   WBC 7.2 04/29/2024   HGB 12.4 04/29/2024   HCT 38.0 04/29/2024   MCV 84.1 04/29/2024   PLT 271 04/29/2024      Latest Ref Rng & Units 04/29/2024   11:53 AM  CMP  Glucose 70 - 99 mg/dL 89   BUN 6 - 20 mg/dL <5   Creatinine 9.55 - 1.00 mg/dL 9.45   Sodium 864 - 854 mmol/L 136   Potassium 3.5 - 5.1 mmol/L 3.6   Chloride 98 - 111 mmol/L 102   CO2 22 - 32 mmol/L 23   Calcium 8.9 - 10.3 mg/dL 9.8   Total Protein 6.5 - 8.1 g/dL 7.0   Total Bilirubin 0.0 - 1.2 mg/dL <9.7   Alkaline Phos 38 - 126 U/L 162   AST 15 - 41 U/L 23   ALT 0 - 44 U/L 13    Edinburgh Score:    05/29/2022    3:31 PM  Edinburgh Postnatal Depression Scale Screening Tool  I have been able to laugh and see the funny side of things. 0   I have looked forward with enjoyment to things. 0   I have blamed myself unnecessarily when things went wrong. 0   I have been anxious or worried for no good reason. 3   I have  felt scared or panicky for no good reason. 3   Things have been getting on top of me. 0   I have been so unhappy that I have had difficulty sleeping. 0   I have felt sad or miserable. 0   I have been so unhappy that I have been crying. 0   The thought of harming myself has occurred to me. 0   Edinburgh Postnatal Depression Scale Total 6      Data saved with a previous flowsheet row definition   No data recorded  After visit meds:  Allergies as of 04/29/2024   No Known Allergies   Med Rec must be completed prior to using this [facility]***        Discharge home in stable condition Infant Feeding: {Baby feeding:23562} Infant Disposition:{CHL IP OB HOME WITH FNUYZM:76418} Discharge instruction: per After Visit  Summary and Postpartum booklet. Activity: Advance as tolerated. Pelvic rest for 6 weeks.  Diet: {OB ipzu:78888878} Future Appointments: Future Appointments  Date Time Provider Department Center  05/05/2024 10:35 AM Ilean, Norleen GAILS, MD [facility]   Follow up Visit:   Please schedule this patient for a In person postpartum visit in 6 weeks with the following provider: Any provider. Additional Postpartum F/U:Postpartum Depression checkup and BP check 1 week  Low risk pregnancy complicated by: elevated blood pressures prior to delivery Delivery mode:  Vaginal, Spontaneous Anticipated Birth Control:  IUD  Message sent 04/29/24 to [facility] ADMIN POOL to make appt with [facility]   04/29/2024 Darren Jernigan, DO

## 2024-04-29 NOTE — Anesthesia Preprocedure Evaluation (Signed)
 Anesthesia Evaluation  Patient identified by MRN, date of birth, ID band Patient awake    Reviewed: Allergy & Precautions, H&P , NPO status , Patient's Chart, lab work & pertinent test results, reviewed documented beta blocker date and time   Airway Mallampati: I  TM Distance: >3 FB Neck ROM: full    Dental no notable dental hx. (+) Teeth Intact, Dental Advisory Given   Pulmonary neg pulmonary ROS   Pulmonary exam normal breath sounds clear to auscultation       Cardiovascular Exercise Tolerance: Good negative cardio ROS Normal cardiovascular exam Rhythm:regular Rate:Normal     Neuro/Psych negative neurological ROS  negative psych ROS   GI/Hepatic negative GI ROS, Neg liver ROS,,,  Endo/Other  negative endocrine ROS    Renal/GU negative Renal ROS  negative genitourinary   Musculoskeletal   Abdominal   Peds  Hematology  (+) Blood dyscrasia, anemia   Anesthesia Other Findings   Reproductive/Obstetrics negative OB ROS                              Anesthesia Physical Anesthesia Plan  ASA: 2  Anesthesia Plan: Epidural   Post-op Pain Management: Minimal or no pain anticipated   Induction: Intravenous  PONV Risk Score and Plan: 2  Airway Management Planned: Natural Airway and Simple Face Mask  Additional Equipment: None  Intra-op Plan:   Post-operative Plan:   Informed Consent: I have reviewed the patients History and Physical, chart, labs and discussed the procedure including the risks, benefits and alternatives for the proposed anesthesia with the patient or authorized representative who has indicated his/her understanding and acceptance.       Plan Discussed with: Anesthesiologist and CRNA  Anesthesia Plan Comments:         Anesthesia Quick Evaluation

## 2024-04-29 NOTE — Telephone Encounter (Signed)
 Preadmission screen

## 2024-04-30 DIAGNOSIS — O139 Gestational [pregnancy-induced] hypertension without significant proteinuria, unspecified trimester: Secondary | ICD-10-CM | POA: Insufficient documentation

## 2024-04-30 LAB — CBC
HCT: 31.2 % — ABNORMAL LOW (ref 36.0–46.0)
Hemoglobin: 10.2 g/dL — ABNORMAL LOW (ref 12.0–15.0)
MCH: 27 pg (ref 26.0–34.0)
MCHC: 32.7 g/dL (ref 30.0–36.0)
MCV: 82.5 fL (ref 80.0–100.0)
Platelets: 232 K/uL (ref 150–400)
RBC: 3.78 MIL/uL — ABNORMAL LOW (ref 3.87–5.11)
RDW: 21.1 % — ABNORMAL HIGH (ref 11.5–15.5)
WBC: 10 K/uL (ref 4.0–10.5)
nRBC: 0 % (ref 0.0–0.2)

## 2024-04-30 MED ORDER — FERROUS SULFATE 325 (65 FE) MG PO TABS
325.0000 mg | ORAL_TABLET | ORAL | Status: DC
  Administered 2024-04-30: 10:00:00 325 mg via ORAL
  Filled 2024-04-30: qty 1

## 2024-04-30 MED ORDER — POTASSIUM CHLORIDE CRYS ER 20 MEQ PO TBCR
20.0000 meq | EXTENDED_RELEASE_TABLET | Freq: Every day | ORAL | Status: DC
  Administered 2024-04-30 – 2024-05-01 (×2): 20 meq via ORAL
  Filled 2024-04-30 (×2): qty 1

## 2024-04-30 MED ORDER — FUROSEMIDE 20 MG PO TABS
20.0000 mg | ORAL_TABLET | Freq: Every day | ORAL | Status: DC
  Administered 2024-04-30 – 2024-05-01 (×2): 20 mg via ORAL
  Filled 2024-04-30 (×2): qty 1

## 2024-04-30 NOTE — Anesthesia Postprocedure Evaluation (Signed)
 Anesthesia Post Note  Patient: Natasha Kelly  Procedure(s) Performed: AN AD HOC LABOR EPIDURAL     Patient location during evaluation: Mother Baby Anesthesia Type: Epidural Level of consciousness: awake and alert Pain management: pain level controlled Vital Signs Assessment: post-procedure vital signs reviewed and stable Respiratory status: spontaneous breathing, nonlabored ventilation and respiratory function stable Cardiovascular status: stable Postop Assessment: no headache, no backache and epidural receding Anesthetic complications: no   No notable events documented.  Last Vitals:  Vitals:   04/30/24 0210 04/30/24 0605  BP: 125/75 120/69  Pulse: (!) 104 (!) 104  Resp: 18 17  Temp:    SpO2: 100% 100%    Last Pain:  Vitals:   04/30/24 0728  TempSrc:   PainSc: 0-No pain   Pain Goal:                   ECHOSTAR

## 2024-04-30 NOTE — Progress Notes (Signed)
 POSTPARTUM PROGRESS NOTE  Post Partum Day #1  Subjective:  Natasha Kelly is a 23 y.o. H6E7987 s/p NSVD at [redacted]w[redacted]d.  She reports she is doing well. No acute events overnight. She denies any problems with ambulating, voiding or po intake. Denies nausea or vomiting.  Pain is moderately controlled.  Lochia is like a period.  Objective: Blood pressure 120/69, pulse (!) 104, temperature 97.7 F (36.5 C), temperature source Axillary, resp. rate 17, height 5' 3 (1.6 m), weight 82.6 kg, last menstrual period 07/05/2023, SpO2 100%, unknown if currently breastfeeding.  Physical Exam:  General: alert, cooperative and no distress Chest: no respiratory distress Heart:regular rate, distal pulses intact Abdomen: soft, nontender,  Uterine Fundus: firm, appropriately tender DVT Evaluation: No calf swelling or tenderness Extremities: no LE edema Skin: warm, dry  Recent Labs    04/29/24 1153 04/30/24 0527  HGB 12.4 10.2*  HCT 38.0 31.2*    Assessment/Plan: Natasha Kelly is a 23 y.o. H6E7987 s/p NSVD at [redacted]w[redacted]d   PPD#1 - Doing well  Routine postpartum care Gestatinoal HTN: normotensive now, start lasix , ctm BP's Contraception: outpatient IUD Feeding: breast Dispo: Plan for discharge PPD#2 (late delivery).   LOS: 1 day    Natasha CHRISTELLA Carolus, MD/MPH Attending Family Medicine Physician, [facility] for [facility], [facility]  04/30/2024, 8:36 AM

## 2024-05-01 ENCOUNTER — Other Ambulatory Visit (HOSPITAL_COMMUNITY): Payer: Self-pay

## 2024-05-01 MED ORDER — FUROSEMIDE 20 MG PO TABS
20.0000 mg | ORAL_TABLET | Freq: Every day | ORAL | 0 refills | Status: AC
  Filled 2024-05-01: qty 4, 4d supply, fill #0

## 2024-05-01 MED ORDER — IBUPROFEN 600 MG PO TABS
600.0000 mg | ORAL_TABLET | Freq: Four times a day (QID) | ORAL | 0 refills | Status: AC
  Filled 2024-05-01: qty 30, 8d supply, fill #0

## 2024-05-01 MED ORDER — POTASSIUM CHLORIDE CRYS ER 20 MEQ PO TBCR
20.0000 meq | EXTENDED_RELEASE_TABLET | Freq: Every day | ORAL | 0 refills | Status: AC
  Filled 2024-05-01: qty 4, 4d supply, fill #0

## 2024-05-01 NOTE — Patient Instructions (Signed)

## 2024-05-01 NOTE — Lactation Note (Signed)
 This note was copied from a baby's chart. Lactation Consultation Note  Patient Name: Girl Saleena Tamas Unijb'd Date: 05/01/2024 Age:22 hours, P2  Reason for consult: Follow-up assessment;Early term 37-38.6wks;Infant weight loss;Breastfeeding assistance Per mom would like to breast feed, but has been cramping and that's why she has been bottle feeding.  LC reassured mom the cramping will get better and encouraged mom to empty her bladder before breast feeding or pumping , heat to the lower abdomen while breast feeding.  LC offered to assist to latch and mom receptive.  LC reviewed breast feeding basics and assisted on the right breast to latch in the football position. Baby fed 15 mins with increased swallows,  Baby calm and still rooting, LC assisted to switch and latch on the left breast and the baby latched by herself with depth. More swallows noted.  LC recommended to take the baby to the breast calm and since she has had several bottles may need and appetizer of EBM or formula 1st and then latch.  LC plan:  Feed with cues and by 3 hours STS, if still hungry after the 1st breast offer the 2nd breast, if satisfied hold off on the supplementing.  If the milk is slow to come in option #2 would be to feed the 1st breast for 20 mins , 30 mins max and supplement 30 ml , post pump both breast for 15 mins , save the milk for the next feeding.  LC reviewed breast feeding D/C teaching and the [facility] resources.  LC offered to request and LC O/P and mom receptive - see below.   Maternal Data Has patient been taught Hand Expression?: Yes Does the patient have breastfeeding experience prior to this delivery?: Yes How long did the patient breastfeed?: per mom pumped x 1 week  Feeding Mother's Current Feeding Choice: Breast Milk and Formula Nipple Type: Slow - flow  LATCH Score Latch: Grasps breast easily, tongue down, lips flanged, rhythmical sucking.  Audible Swallowing: Spontaneous and  intermittent  Type of Nipple: Everted at rest and after stimulation  Comfort (Breast/Nipple): Soft / non-tender  Hold (Positioning): Assistance needed to correctly position infant at breast and maintain latch.  LATCH Score: 9   Lactation Tools Discussed/Used Tools: Pump;Flanges Flange Size: 18;21 Breast pump type: Double-Electric Breast Pump;Manual Pump Education: Milk Storage;Setup, frequency, and cleaning Reason for Pumping: DEBP was set up yesterday by nursing staff due to baby receiving a number of bottles - for breast stimulation  Interventions Interventions: Breast feeding basics reviewed;Assisted with latch;Skin to skin;Breast massage;Hand express;Pre-pump if needed;Breast compression;Adjust position;Support pillows;Position options;DEBP;Hand pump;Education;LC Services brochure;CDC milk storage guidelines;CDC Guidelines for Breast Pump Cleaning  Discharge Discharge Education: Engorgement and breast care;Warning signs for feeding baby;Outpatient recommendation;Outpatient Epic message sent;Other (comment) (LC offered to request and LC O/P and mom receptive.) Pump: Personal;Hands Free;Manual  Consult Status Consult Status: Complete Date: 05/01/24    Rollene Caldron Deral Schellenberg 05/01/2024, 9:06 AM

## 2024-05-01 NOTE — Lactation Note (Signed)
 This note was copied from a baby's chart. Lactation Consultation Note  Patient Name: Natasha Kelly Date: 05/01/2024 Age:22 hours   Attempted to see mom several times during the night and this morning and she was sleeping. Asked RN if mom was BF RN stated she had only seen formula feeding. Mom told RN when asked if she wanted to see Lactation mom stated yes but she has all ready seen Lactation. No noted from Lactation.   Maternal Data    Feeding    LATCH Score                    Lactation Tools Discussed/Used    Interventions    Discharge    Consult Status      Natasha Kelly 05/01/2024, 6:43 AM

## 2024-05-05 ENCOUNTER — Encounter: Admitting: Family Medicine

## 2024-05-06 ENCOUNTER — Telehealth (HOSPITAL_COMMUNITY): Payer: Self-pay

## 2024-05-06 ENCOUNTER — Ambulatory Visit: Payer: Self-pay

## 2024-05-06 NOTE — Telephone Encounter (Signed)
 05/06/2024 1340  Name: Zissy Hamlett MRN: [id number] DOB: [birth year]  Reason for Call:  Transition of Care Hospital Discharge Call  Contact Status: Patient Contact Status: Message  Language assistant needed:          Follow-Up Questions:    Van Postnatal Depression Scale:  In the Past 7 Days:    PHQ2-9 Depression Scale:     Discharge Follow-up:    Post-discharge interventions: NA  Signature  Rosaline Deretha PEAK

## 2024-05-07 ENCOUNTER — Inpatient Hospital Stay (HOSPITAL_COMMUNITY)

## 2024-05-07 ENCOUNTER — Inpatient Hospital Stay (HOSPITAL_COMMUNITY): Admission: RE | Admit: 2024-05-07 | Source: Home / Self Care | Admitting: Family Medicine

## 2024-05-16 NOTE — BH Specialist Note (Deleted)
"     Integrated Behavioral Health via Telemedicine Visit  05/16/2024 Ernestina Joe [id number]  Number of Integrated Behavioral Health Clinician visits: No data recorded Session Start time: No data recorded  Session End time: No data recorded Total time in minutes: No data recorded   Referring Provider: *** Patient/Family location: *** [facility] Provider location: *** All persons participating in visit: *** Types of Service: {CHL AMB TYPE OF SERVICE:[phone]}  I connected with Ledon Fireman and/or Woodrow Folger's {family members:20773} via  Telephone or Engineer, Civil (consulting)  (Video is Surveyor, mining) and verified that I am speaking with the correct person using two identifiers. Discussed confidentiality: {YES/NO:21197}  I discussed the limitations of telemedicine and the availability of in person appointments.  Discussed there is a possibility of technology failure and discussed alternative modes of communication if that failure occurs.  I discussed that engaging in this telemedicine visit, they consent to the provision of behavioral healthcare and the services will be billed under their insurance.  Patient and/or legal guardian expressed understanding and consented to Telemedicine visit: {YES/NO:21197}  Presenting Concerns: Patient and/or family reports the following symptoms/concerns: *** Duration of problem: ***; Severity of problem: {Mild/Moderate/Severe:20260}  Patient and/or Family's Strengths/Protective Factors: {CHL AMB BH PROTECTIVE FACTORS:[phone]}  Goals Addressed: Patient will:  Reduce symptoms of: {IBH Symptoms:21014056}   Increase knowledge and/or ability of: {IBH Patient Tools:21014057}   Demonstrate ability to: {IBH Goals:21014053}  Progress towards Goals: {CHL AMB BH PROGRESS TOWARDS GOALS:[id number]}    Interventions: Interventions utilized:  {IBH Interventions:21014054} Standardized Assessments completed: {IBH Screening  Tools:21014051}    Patient and/or Family Response: ***  Clinical Assessment/Diagnosis  No diagnosis found.    Assessment: Patient currently experiencing ***.   Patient may benefit from ***.  Plan: Follow up with behavioral health clinician on : *** Behavioral recommendations: *** Referral(s): {IBH Referrals:21014055}  I discussed the assessment and treatment plan with the patient and/or parent/guardian. They were provided an opportunity to ask questions and all were answered. They agreed with the plan and demonstrated an understanding of the instructions.   They were advised to call back or seek an in-person evaluation if the symptoms worsen or if the condition fails to improve as anticipated.  Cartier Mapel C Frederick Klinger, LCSW "

## 2024-05-30 ENCOUNTER — Ambulatory Visit: Admitting: Family Medicine

## 2024-06-02 ENCOUNTER — Ambulatory Visit: Admitting: Family Medicine
